# Patient Record
Sex: Female | Born: 1935 | Race: White | Hispanic: No | Smoking: Never smoker
Health system: Southern US, Community
[De-identification: ages and names within clinical notes are randomized; demographics above are authoritative.]

## PROBLEM LIST (undated history)

## (undated) DIAGNOSIS — I872 Venous insufficiency (chronic) (peripheral): Secondary | ICD-10-CM

## (undated) DIAGNOSIS — G459 Transient cerebral ischemic attack, unspecified: Secondary | ICD-10-CM

## (undated) DIAGNOSIS — A809 Acute poliomyelitis, unspecified: Secondary | ICD-10-CM

## (undated) DIAGNOSIS — E538 Deficiency of other specified B group vitamins: Secondary | ICD-10-CM

## (undated) DIAGNOSIS — J449 Chronic obstructive pulmonary disease, unspecified: Secondary | ICD-10-CM

## (undated) DIAGNOSIS — I10 Essential (primary) hypertension: Secondary | ICD-10-CM

## (undated) DIAGNOSIS — K219 Gastro-esophageal reflux disease without esophagitis: Secondary | ICD-10-CM

## (undated) DIAGNOSIS — E119 Type 2 diabetes mellitus without complications: Secondary | ICD-10-CM

## (undated) DIAGNOSIS — G14 Postpolio syndrome: Secondary | ICD-10-CM

## (undated) DIAGNOSIS — Z7901 Long term (current) use of anticoagulants: Secondary | ICD-10-CM

## (undated) DIAGNOSIS — K5790 Diverticulosis of intestine, part unspecified, without perforation or abscess without bleeding: Secondary | ICD-10-CM

## (undated) DIAGNOSIS — M1990 Unspecified osteoarthritis, unspecified site: Secondary | ICD-10-CM

## (undated) DIAGNOSIS — T8859XA Other complications of anesthesia, initial encounter: Secondary | ICD-10-CM

## (undated) DIAGNOSIS — E7800 Pure hypercholesterolemia, unspecified: Secondary | ICD-10-CM

## (undated) DIAGNOSIS — E785 Hyperlipidemia, unspecified: Secondary | ICD-10-CM

## (undated) DIAGNOSIS — I4891 Unspecified atrial fibrillation: Secondary | ICD-10-CM

## (undated) DIAGNOSIS — M810 Age-related osteoporosis without current pathological fracture: Secondary | ICD-10-CM

## (undated) DIAGNOSIS — M5136 Other intervertebral disc degeneration, lumbar region: Secondary | ICD-10-CM

## (undated) DIAGNOSIS — E559 Vitamin D deficiency, unspecified: Secondary | ICD-10-CM

## (undated) DIAGNOSIS — Z87442 Personal history of urinary calculi: Secondary | ICD-10-CM

## (undated) HISTORY — PX: BACK SURGERY: SHX140

## (undated) HISTORY — PX: KYPHOPLASTY: SHX5884

## (undated) HISTORY — PX: HIP SURGERY: SHX245

---

## 2004-01-03 ENCOUNTER — Ambulatory Visit: Payer: Self-pay | Admitting: Internal Medicine

## 2005-02-02 ENCOUNTER — Ambulatory Visit: Payer: Self-pay | Admitting: Internal Medicine

## 2005-08-25 ENCOUNTER — Ambulatory Visit: Payer: Self-pay | Admitting: Gastroenterology

## 2005-10-05 ENCOUNTER — Other Ambulatory Visit: Payer: Self-pay

## 2005-10-05 ENCOUNTER — Inpatient Hospital Stay: Payer: Self-pay | Admitting: Unknown Physician Specialty

## 2005-10-15 ENCOUNTER — Emergency Department: Payer: Self-pay | Admitting: Emergency Medicine

## 2006-02-18 ENCOUNTER — Ambulatory Visit: Payer: Self-pay | Admitting: Internal Medicine

## 2007-02-24 ENCOUNTER — Ambulatory Visit: Payer: Self-pay | Admitting: Internal Medicine

## 2008-02-27 ENCOUNTER — Ambulatory Visit: Payer: Self-pay | Admitting: Internal Medicine

## 2008-03-16 HISTORY — PX: FEMUR FRACTURE SURGERY: SHX633

## 2008-03-30 ENCOUNTER — Ambulatory Visit: Payer: Self-pay | Admitting: Family Medicine

## 2008-05-01 ENCOUNTER — Ambulatory Visit: Payer: Self-pay | Admitting: Internal Medicine

## 2009-01-31 ENCOUNTER — Ambulatory Visit: Payer: Self-pay | Admitting: Gastroenterology

## 2009-03-01 ENCOUNTER — Ambulatory Visit: Payer: Self-pay | Admitting: Internal Medicine

## 2009-03-08 ENCOUNTER — Inpatient Hospital Stay: Payer: Self-pay | Admitting: Orthopedic Surgery

## 2009-06-12 ENCOUNTER — Encounter: Payer: Self-pay | Admitting: Orthopedic Surgery

## 2009-06-14 ENCOUNTER — Encounter: Payer: Self-pay | Admitting: Orthopedic Surgery

## 2009-07-14 ENCOUNTER — Encounter: Payer: Self-pay | Admitting: Orthopedic Surgery

## 2009-08-14 ENCOUNTER — Encounter: Payer: Self-pay | Admitting: Orthopedic Surgery

## 2009-09-13 ENCOUNTER — Encounter: Payer: Self-pay | Admitting: Orthopedic Surgery

## 2010-03-13 ENCOUNTER — Ambulatory Visit: Payer: Self-pay | Admitting: Internal Medicine

## 2011-03-26 ENCOUNTER — Ambulatory Visit: Payer: Self-pay | Admitting: Internal Medicine

## 2012-03-31 ENCOUNTER — Ambulatory Visit: Payer: Self-pay | Admitting: Internal Medicine

## 2012-08-05 ENCOUNTER — Emergency Department: Payer: Self-pay | Admitting: Emergency Medicine

## 2012-11-11 ENCOUNTER — Ambulatory Visit: Payer: Self-pay | Admitting: Gastroenterology

## 2012-11-18 LAB — PATHOLOGY REPORT

## 2013-09-27 ENCOUNTER — Ambulatory Visit: Payer: Self-pay | Admitting: Internal Medicine

## 2013-11-25 ENCOUNTER — Emergency Department: Payer: Self-pay | Admitting: Emergency Medicine

## 2014-01-15 ENCOUNTER — Ambulatory Visit: Payer: Self-pay | Admitting: Ophthalmology

## 2014-05-11 ENCOUNTER — Ambulatory Visit: Payer: Self-pay | Admitting: Unknown Physician Specialty

## 2014-05-15 ENCOUNTER — Ambulatory Visit: Payer: Self-pay | Admitting: Orthopedic Surgery

## 2014-07-09 LAB — SURGICAL PATHOLOGY

## 2014-07-15 NOTE — Op Note (Signed)
PATIENT NAME:  Jennifer [name], [name] MR#:  [id number] DATE OF BIRTH:  1931/12/31  DATE OF PROCEDURE:  05/15/2014  PREOPERATIVE DIAGNOSIS: L1 and L4 compression fractures.   POSTOPERATIVE DIAGNOSIS: L1 and L4 compression fracture.   PROCEDURE: L1 and L4 kyphoplasty.   ANESTHESIA: MAC.   SURGEON: Kennedy BuckerMichael Clifford Coudriet, MD   DESCRIPTION OF PROCEDURE: The patient was brought to the operating room and after adequate sedation was given, the patient was placed prone. C-arm was brought in, and L1 and L4 well visualized in both AP and lateral projections. The back was then infiltrated with 1% Xylocaine. After a timeout procedure was completed, the back was then prepped and draped in the usual sterile manner and repeat timeout procedure and patient identification were completed. A spinal needle was then used to get local anesthetic down to the pedicle on the right side at each level with a mixture of 0.5% Sensorcaine with epinephrine and 1% Xylocaine. A small incision was made and the trocar advanced doing L1 first, getting down to the lateral pedicle, advancing into the vertebral body and obtaining a biopsy, drilling and placement of a balloon across the midline, inflating this to about 3 mL with good fill of the vertebral body with expansion of the superior endplate deformity. This was left in place and the identical procedure was carried out at L4. With visualization at L4, the balloon was let down and cement placed into the defect created by the balloon with good interdigitation, approximately 4 mL of bone cement being infiltrated into the L4 vertebral body. A trocar was removed and permanent C-arm views obtained. Going up to L1, it was felt in a similar fashion with approximately 4.5 mL of bone cement with good fill of the vertebral body. Trocar was removed and AP, lateral permanent views obtained. The wounds were closed with Dermabond followed by a Band-Aid. The patient was sent to the recovery room in stable condition.    ESTIMATED BLOOD LOSS: Minimal.   COMPLICATIONS: None.   SPECIMEN: L1 and L4 vertebral body biopsy.  FINDINGS: The bone was quite soft on placement of the trocar as well as drilling in the biopsy.   ____________________________ Leitha SchullerMichael J. Opaline Reyburn, MD mjm:TM D: 05/15/2014 18:35:47 ET T: 05/16/2014 00:46:44 ET JOB#: [id number]  cc: Leitha SchullerMichael J. Llesenia Fogal, MD, <Dictator> Leitha SchullerMICHAEL J Salsabeel Gorelick MD ELECTRONICALLY SIGNED 05/16/2014 7:31

## 2014-11-08 ENCOUNTER — Emergency Department: Payer: Medicare Other

## 2014-11-08 ENCOUNTER — Emergency Department
Admission: EM | Admit: 2014-11-08 | Discharge: 2014-11-08 | Disposition: A | Discharge status: Home / Self Care | Payer: Medicare Other | Attending: Emergency Medicine | Admitting: Emergency Medicine

## 2014-11-08 ENCOUNTER — Encounter: Payer: Self-pay | Admitting: Emergency Medicine

## 2014-11-08 DIAGNOSIS — Y92018 Other place in single-family (private) house as the place of occurrence of the external cause: Secondary | ICD-10-CM | POA: Insufficient documentation

## 2014-11-08 DIAGNOSIS — Z8612 Personal history of poliomyelitis: Secondary | ICD-10-CM | POA: Diagnosis not present

## 2014-11-08 DIAGNOSIS — T1490XA Injury, unspecified, initial encounter: Secondary | ICD-10-CM

## 2014-11-08 DIAGNOSIS — X58XXXA Exposure to other specified factors, initial encounter: Secondary | ICD-10-CM | POA: Insufficient documentation

## 2014-11-08 DIAGNOSIS — M25561 Pain in right knee: Secondary | ICD-10-CM

## 2014-11-08 DIAGNOSIS — Y998 Other external cause status: Secondary | ICD-10-CM | POA: Diagnosis not present

## 2014-11-08 DIAGNOSIS — S50311A Abrasion of right elbow, initial encounter: Secondary | ICD-10-CM | POA: Insufficient documentation

## 2014-11-08 DIAGNOSIS — S8391XA Sprain of unspecified site of right knee, initial encounter: Secondary | ICD-10-CM | POA: Insufficient documentation

## 2014-11-08 DIAGNOSIS — S8991XA Unspecified injury of right lower leg, initial encounter: Secondary | ICD-10-CM | POA: Diagnosis present

## 2014-11-08 DIAGNOSIS — Y9389 Activity, other specified: Secondary | ICD-10-CM | POA: Insufficient documentation

## 2014-11-08 HISTORY — DX: Unspecified osteoarthritis, unspecified site: M19.90

## 2014-11-08 NOTE — ED Provider Notes (Signed)
[facility] Emergency Department Provider Note  ____________________________________________  Time seen: Approximately 9:40 PM  I have reviewed the triage vital signs and the nursing notes.   HISTORY  Chief Complaint Fall    HPI Jennifer [name] is a 81 y.o. 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  Reportedly she was walking out of the porch to get mail and she hurt her right knee pop.  I will then to the ground and had to crawl a little bit to get to a surface that he continues to pull herself up.  She is having some pain in that knee.  She did not sustain any other injuries other than a small abrasion near her right elbow.  She did not hit her head, lose consciousness, nor sustain any neck injuries.  She is able to walk with a slight limp but was worried given the her right leg as her "good leg".   Past Medical History  Diagnosis Date  . Arthritis     There are no active problems to display for this patient.   Past Surgical History  Procedure Laterality Date  . Back surgery    . Femur fracture surgery  2010    Current Outpatient Rx  Name  Route  Sig  Dispense  Refill  . ibuprofen (ADVIL,MOTRIN) 200 MG tablet   Oral   Take 200 mg by mouth every 6 (six) hours as needed for mild pain.           Allergies Review of patient's allergies indicates no known allergies.  History reviewed. No pertinent family history.  Social History Social History  Substance Use Topics  . Smoking status: Never Smoker   . Smokeless tobacco: None  . Alcohol Use: No    Review of Systems Cardiovascular: Denies chest pain. Respiratory: Denies shortness of breath. Gastrointestinal: No abdominal pain.  No nausea, no vomiting.  No diarrhea.  No constipation. Genitourinary: Negative for dysuria. Musculoskeletal: Negative for back pain.   Pain in her right knee Skin: Negative for rash.  Slight abrasion on right arm near her elbow Neurological: Negative for headaches, focal weakness or numbness.   ____________________________________________   PHYSICAL EXAM:  VITAL SIGNS: ED Triage Vitals  Enc Vitals Group     BP 11/08/14 2059 118/88 mmHg     Pulse Rate 11/08/14 2059 73     Resp 11/08/14 2059 18     Temp 11/08/14 2059 98.1 F (36.7 C)     Temp src --      SpO2 11/08/14 2059 93 %     Weight 11/08/14 2059 140 lb (63.504 kg)     Height 11/08/14 2059  (1.626 m)     Head Cir --      Peak Flow --      Pain Score 11/08/14 2059 5     Pain Loc --      Pain Edu? --      Excl. in GC? --     Constitutional: Alert and oriented. Well appearing and in no acute distress. Eyes: Conjunctivae are normal. PERRL. EOMI. Head: Atraumatic. Neck: No stridor.  No cervical spine tenderness to palpation. Cardiovascular: Normal rate, regular rhythm. Grossly normal heart sounds.  Good peripheral circulation. Respiratory: Normal respiratory effort.  No retractions. Lungs CTAB. Musculoskeletal: No ecchymosis nor lacerations the right knee.  Normal range of motion  with very minimal tenderness.  Knee is stable.  No deformities.  No tenderness to palpation.  Small infrapatellar effusion that the patient states is chronic. Neurologic:  Normal speech and language. No gross focal neurologic deficits are appreciated.  Skin:  Skin is warm, dry and intact. No rash noted.  Very mild abrasion to a dime-sized area near her right elbow. Psychiatric: Mood and affect are normal. Speech and behavior are normal.  ____________________________________________   LABS (all labs ordered are listed, but only abnormal results are displayed)  Not indicated ____________________________________________  EKG  Not indicated ____________________________________________  RADIOLOGY  Dg Knee Complete 4 Views Right  11/08/2014   CLINICAL DATA:  Patient  went to get the mail today and hurt her right knee pop with right knee pain.  EXAM: RIGHT KNEE - COMPLETE 4+ VIEW  COMPARISON:  March 10, 2009  FINDINGS: There is no evidence of fracture, dislocation, or joint effusion. Patient status post fixation of the femur. There is no malalignment. Degenerative joint changes of the right knee with narrowed joint space and osteophyte formation are noted. Soft tissues are unremarkable.  IMPRESSION: No acute abnormality.   Electronically Signed   By: Sherian Rein M.D.   On: 11/08/2014 21:35    ____________________________________________   PROCEDURES  Procedure(s) performed: None  Critical Care performed: No ____________________________________________   INITIAL IMPRESSION / ASSESSMENT AND PLAN / ED COURSE  Pertinent labs & imaging results that were available during my care of the patient were reviewed by me and considered in my medical decision making (see chart for details).  The patient is well-appearing and in no acute distress.  She has no evidence of bony injury to the right knee and she is able to bear weight although with a slight limp.  I advised that she allow [place] to apply an Ace wrap and she can follow up next week with Dr. Gavin Potters or Dr. Martha Clan, both of whom she has seen in the past.  I offered crutches but the patient states that she has a walker at home and would prefer to use that.  I explained that I cannot rule out a ligamentous injury or other internal derangement of the knee, but I explained that her exam is reassuring today.  I gave her my usual and customary return precautions.  I advised the use of over-the-counter pain medication and she agrees.  ____________________________________________  FINAL CLINICAL IMPRESSION(S) / ED DIAGNOSES  Final diagnoses:  Pain of right knee after injury      NEW MEDICATIONS STARTED DURING THIS VISIT:  New Prescriptions   No medications on file     Loleta Rose, MD 11/08/14 2309

## 2014-11-08 NOTE — ED Notes (Signed)
Pt. States she went out to porch to get mail, when she heard her rt. Knee "pop".  Pt. States she went down on rt. Knee.  Pt. States pain to rt. Knee.  Pt. Denies LOC.

## 2014-11-08 NOTE — Discharge Instructions (Signed)
Fortunately does not appear that you causes severe injury to your right knee today.  You may have some internal injuries to the soft tissue of the knee that are not yet apparent.  Please rest the knee when possible, use ice packs and elevation, and follow-up with Dr. Martha Clan or Dr. Gavin Potters next week.  If you develop new or worsening symptoms that concern you, please return immediately to the emergency department.   Knee Sprain A knee sprain is a tear in one of the strong, fibrous tissues that connect the bones (ligaments) in your knee. The severity of the sprain depends on how much of the ligament is torn. The tear can be either partial or complete. CAUSES  Often, sprains are a result of a fall or injury. The force of the impact causes the fibers of your ligament to stretch too much. This excess tension causes the fibers of your ligament to tear. SIGNS AND SYMPTOMS  You may have some loss of motion in your knee. Other symptoms include:  Bruising.  Pain in the knee area.  Tenderness of the knee to the touch.  Swelling. DIAGNOSIS  To diagnose a knee sprain, your health care provider will physically examine your knee. Your health care provider may also suggest an X-ray exam of your knee to make sure no bones are broken. TREATMENT  If your ligament is only partially torn, treatment usually involves keeping the knee in a fixed position (immobilization) or bracing your knee for activities that require movement for several weeks. To do this, your health care provider will apply a bandage, cast, or splint to keep your knee from moving and to support your knee during movement until it heals. For a partially torn ligament, the healing process usually takes 4-6 weeks. If your ligament is completely torn, depending on which ligament it is, you may need surgery to reconnect the ligament to the bone or reconstruct it. After surgery, a cast or splint may be applied and will need to stay on your knee for 4-6  weeks while your ligament heals. HOME CARE INSTRUCTIONS  Keep your injured knee elevated to decrease swelling.  To ease pain and swelling, apply ice to the injured area:  Put ice in a plastic bag.  Place a towel between your skin and the bag.  Leave the ice on for 20 minutes, 2-3 times a day.  Only take medicine for pain as directed by your health care provider.  Do not leave your knee unprotected until pain and stiffness go away (usually 4-6 weeks).  If you have a cast or splint, do not allow it to get wet. If you have been instructed not to remove it, cover it with a plastic bag when you shower or bathe. Do not swim.  Your health care provider may suggest exercises for you to do during your recovery to prevent or limit permanent weakness and stiffness. SEEK IMMEDIATE MEDICAL CARE IF:  Your cast or splint becomes damaged.  Your pain becomes worse.  You have significant pain, swelling, or numbness below the cast or splint. MAKE SURE YOU:  Understand these instructions.  Will watch your condition.  Will get help right away if you are not doing well or get worse. Document Released: 03/02/2005 Document Revised: 12/21/2012 Document Reviewed: 10/12/2012 [facility] Patient Information 2015 [place], [place]. This information is not intended to replace advice given to you by your health care provider. Make sure you discuss any questions you have with your health care provider.

## 2015-03-01 ENCOUNTER — Encounter: Payer: Self-pay | Admitting: Emergency Medicine

## 2015-03-01 ENCOUNTER — Emergency Department: Payer: Medicare Other

## 2015-03-01 ENCOUNTER — Emergency Department
Admission: EM | Admit: 2015-03-01 | Discharge: 2015-03-01 | Disposition: A | Discharge status: Home / Self Care | Payer: Medicare Other | Attending: Emergency Medicine | Admitting: Emergency Medicine

## 2015-03-01 DIAGNOSIS — Y9389 Activity, other specified: Secondary | ICD-10-CM | POA: Diagnosis not present

## 2015-03-01 DIAGNOSIS — T7121XA Asphyxiation due to cave-in or falling earth, initial encounter: Secondary | ICD-10-CM

## 2015-03-01 DIAGNOSIS — S32502D Unspecified fracture of left pubis, subsequent encounter for fracture with routine healing: Secondary | ICD-10-CM | POA: Diagnosis not present

## 2015-03-01 DIAGNOSIS — S79912A Unspecified injury of left hip, initial encounter: Secondary | ICD-10-CM | POA: Diagnosis present

## 2015-03-01 DIAGNOSIS — Y998 Other external cause status: Secondary | ICD-10-CM | POA: Diagnosis not present

## 2015-03-01 DIAGNOSIS — Y9289 Other specified places as the place of occurrence of the external cause: Secondary | ICD-10-CM | POA: Insufficient documentation

## 2015-03-01 DIAGNOSIS — S32592D Other specified fracture of left pubis, subsequent encounter for fracture with routine healing: Secondary | ICD-10-CM

## 2015-03-01 DIAGNOSIS — W010XXD Fall on same level from slipping, tripping and stumbling without subsequent striking against object, subsequent encounter: Secondary | ICD-10-CM | POA: Diagnosis not present

## 2015-03-01 MED ORDER — KETOROLAC TROMETHAMINE 60 MG/2ML IM SOLN
60.0000 mg | Freq: Once | INTRAMUSCULAR | Status: AC
  Administered 2015-03-01: 60 mg via INTRAMUSCULAR
  Filled 2015-03-01: qty 2

## 2015-03-01 MED ORDER — IBUPROFEN 600 MG PO TABS: 600.0000 mg | ORAL_TABLET | Freq: Three times a day (TID) | ORAL | Status: DC | PRN

## 2015-03-01 NOTE — ED Notes (Signed)
Pt states she got tripped up this am, fell back and states she landed on her back side, here with c/o pain in her left hip and groin area. Appears in no distress at this time. History of left hip replacement years ago.

## 2015-03-01 NOTE — ED Provider Notes (Addendum)
[facility] Emergency Department Provider Note  ____________________________________________  Time seen: Approximately 2:21 PM  I have reviewed the triage vital signs and the nursing notes.   HISTORY  Chief Complaint Fall and Hip Pain    HPI Jennifer [name] is a 81 y.o. female with a history of polio in the left leg requiring a brace for walking, remote left hip arthroplasty, remote right hip arthroplasty, presenting with mechanical fall and left pelvis pain. Patient reports that prior to arrival she tried to move backwards in her apartment but her sock was stuck to some glue on the ground and she fell backwards landing on her bottom. No LOC, no pain in the neck or back, no chest pain, shortness of breath, palpitations, numbness tingling or weakness.   Past Medical History  Diagnosis Date  . Arthritis     There are no active problems to display for this patient.   Past Surgical History  Procedure Laterality Date  . Back surgery    . Femur fracture surgery  2010    Current Outpatient Rx  Name  Route  Sig  Dispense  Refill  . ibuprofen (ADVIL,MOTRIN) 200 MG tablet   Oral   Take 200 mg by mouth every 6 (six) hours as needed for mild pain.           Allergies Review of patient's allergies indicates no known allergies.  No family history on file.  Social History Social History  Substance Use Topics  . Smoking status: Never Smoker   . Smokeless tobacco: None  . Alcohol Use: No    Review of Systems Constitutional: No fever/chills. No syncope or lightheadedness. Eyes: No visual changes. No blurred or double vision. ENT: No sore throat. Cardiovascular: Denies chest pain, palpitations. Respiratory: Denies shortness of breath.  No cough. Gastrointestinal: No abdominal pain.  No nausea, no vomiting.  No diarrhea.  No constipation. Genitourinary: Negative for dysuria. Musculoskeletal: Negative for back pain. Left pelvis pain. Skin:  Negative for rash. Neurological: Negative for headaches, focal weakness or numbness.  10-point ROS otherwise negative.  ____________________________________________   PHYSICAL EXAM:  VITAL SIGNS: ED Triage Vitals  Enc Vitals Group     BP 03/01/15 1320 141/99 mmHg     Pulse Rate 03/01/15 1320 91     Resp 03/01/15 1320 18     Temp 03/01/15 1320 98.1 F (36.7 C)     Temp Source 03/01/15 1320 Oral     SpO2 03/01/15 1320 97 %     Weight 03/01/15 1320 139 lb (63.05 kg)     Height 03/01/15 1320  (1.626 m)     Head Cir --      Peak Flow --      Pain Score 03/01/15 1320 0     Pain Loc --      Pain Edu? --      Excl. in GC? --     Constitutional: Alert and oriented. Well appearing and in no acute distress. Answer question appropriately. Eyes: Conjunctivae are normal.  EOMI. no scleral icterus. Head: Atraumatic. Nose: No congestion/rhinnorhea. Mouth/Throat: Mucous membranes are moist.  Neck: No stridor.  Supple.  No midline C-spine tenderness, step-offs or deformities. Cardiovascular: Normal rate,   Respiratory: Normal respiratory effort.   Gastrointestinal: Soft and nontender. No distention. No peritoneal signs. Musculoskeletal: No LE edema. Pelvis is stable. Full range of motion of bilateral ankles knees and hips without pain. Pain is reproducible with palpation over the left side of the  pubic symphysis. Normal DP and PT pulses bilaterally. 5 out of 5 dorsi flexion and plantar flexion. Decreased muscle bulk in the left leg which is likely chronic. Neurologic:  Normal speech and language. No gross focal neurologic deficits are appreciated.  Skin:  Skin is warm, dry and intact. No rash noted. Psychiatric: Mood and affect are normal. Speech and behavior are normal.  Normal judgement.  ____________________________________________   LABS (all labs ordered are listed, but only abnormal results are displayed)  Labs Reviewed - No data to  display ____________________________________________  EKG  Not indicated ____________________________________________  RADIOLOGY  Dg Hip Unilat With Pelvis 2-3 Views Left  03/01/2015  CLINICAL DATA:  Left hip pain following fall today, initial encounter EXAM: DG HIP (WITH OR WITHOUT PELVIS) 2-3V LEFT COMPARISON:  None. FINDINGS: Left hip prosthesis is noted. No definitive loosening or acute fracture is noted in the proximal femur. The well corticated lucency is noted in the inferior pubic ramus on the left likely representing prior fracture. Changes of prior vertebral augmentation in the lumbar spine are seen. IMPRESSION: Postsurgical changes identified. Changes of prior inferior pubic ramus fracture are seen. No acute abnormality is noted. Electronically Signed   By: Alcide CleverMark  Lukens M.D.   On: 03/01/2015 14:13    ____________________________________________   PROCEDURES  Procedure(s) performed: None  Critical Care performed: No ____________________________________________   INITIAL IMPRESSION / ASSESSMENT AND PLAN / ED COURSE  Pertinent labs & imaging results that were available during my care of the patient were reviewed by me and considered in my medical decision making (see chart for details).  81 y.o. female with a history of polio in the left leg requiring braces to walk, bilateral hip arthroplasties, presenting with left pubic symphysis pain after fall. I will get a pelvis and hip x-ray. Initiate symptomatically treatment.  ----------------------------------------- 2:25 PM on 03/01/2015 -----------------------------------------  Patient has an x-ray which shows prior inferior pubic ramus fracture but no new findings.  Given that a stable pubic ramus fracture is weight bear as tolerated and nonoperative, there is no indication for CT scan for further evaluation. I will attempt symptomatic treatment with a attempt at ambulation with a walker. If she is steady she'll be able to  be discharged home.  ____________________________________________  FINAL CLINICAL IMPRESSION(S) / ED DIAGNOSES  Final diagnoses:  Accidental mechanical suffocation by falling material, initial encounter  Pubic ramus fracture, left, with routine healing, subsequent encounter      NEW MEDICATIONS STARTED DURING THIS VISIT:  New Prescriptions   No medications on file     Rockne MenghiniAnne-Caroline Makhai Fulco, MD 03/01/15 1428  Rockne MenghiniAnne-Caroline Jeanae Whitmill, MD 03/01/15 1429

## 2015-03-01 NOTE — Discharge Instructions (Signed)
Please take Motrin or Tylenol for mild to moderate pain. You may take Percocet for severe pain but be careful because this medication can make you even more unsteady on your feet. No driving for 8 hours after taking Percocet.  Please use your walker at all times to prevent any further falls until your pain has completely resolved.  Please return to the emergency department if you develop severe pain, inability to walk, numbness tingling or weakness, or any other symptoms concerning to you.

## 2015-04-02 ENCOUNTER — Emergency Department: Payer: Medicare Other

## 2015-04-02 ENCOUNTER — Emergency Department
Admission: EM | Admit: 2015-04-02 | Discharge: 2015-04-02 | Disposition: A | Discharge status: Other Acute Inpatient Hospital | Payer: Medicare Other | Attending: Emergency Medicine | Admitting: Emergency Medicine

## 2015-04-02 ENCOUNTER — Encounter: Payer: Self-pay | Admitting: Emergency Medicine

## 2015-04-02 DIAGNOSIS — S2242XA Multiple fractures of ribs, left side, initial encounter for closed fracture: Secondary | ICD-10-CM | POA: Insufficient documentation

## 2015-04-02 DIAGNOSIS — S2241XA Multiple fractures of ribs, right side, initial encounter for closed fracture: Secondary | ICD-10-CM | POA: Diagnosis not present

## 2015-04-02 DIAGNOSIS — S270XXA Traumatic pneumothorax, initial encounter: Secondary | ICD-10-CM | POA: Diagnosis not present

## 2015-04-02 DIAGNOSIS — Y92410 Unspecified street and highway as the place of occurrence of the external cause: Secondary | ICD-10-CM | POA: Insufficient documentation

## 2015-04-02 DIAGNOSIS — Y9389 Activity, other specified: Secondary | ICD-10-CM | POA: Diagnosis not present

## 2015-04-02 DIAGNOSIS — Z79899 Other long term (current) drug therapy: Secondary | ICD-10-CM | POA: Insufficient documentation

## 2015-04-02 DIAGNOSIS — I10 Essential (primary) hypertension: Secondary | ICD-10-CM | POA: Diagnosis not present

## 2015-04-02 DIAGNOSIS — S161XXA Strain of muscle, fascia and tendon at neck level, initial encounter: Secondary | ICD-10-CM

## 2015-04-02 DIAGNOSIS — J939 Pneumothorax, unspecified: Secondary | ICD-10-CM

## 2015-04-02 DIAGNOSIS — M542 Cervicalgia: Secondary | ICD-10-CM | POA: Diagnosis not present

## 2015-04-02 DIAGNOSIS — S29001A Unspecified injury of muscle and tendon of front wall of thorax, initial encounter: Secondary | ICD-10-CM | POA: Diagnosis present

## 2015-04-02 DIAGNOSIS — Y998 Other external cause status: Secondary | ICD-10-CM | POA: Diagnosis not present

## 2015-04-02 DIAGNOSIS — S2222XA Fracture of body of sternum, initial encounter for closed fracture: Secondary | ICD-10-CM | POA: Insufficient documentation

## 2015-04-02 DIAGNOSIS — S2220XA Unspecified fracture of sternum, initial encounter for closed fracture: Secondary | ICD-10-CM

## 2015-04-02 DIAGNOSIS — R079 Chest pain, unspecified: Secondary | ICD-10-CM

## 2015-04-02 HISTORY — DX: Essential (primary) hypertension: I10

## 2015-04-02 LAB — TYPE AND SCREEN
ABO/RH(D): O POS
ANTIBODY SCREEN: NEGATIVE

## 2015-04-02 LAB — CBC
HCT: 39.8 % (ref 35.0–47.0)
HEMOGLOBIN: 13.1 g/dL (ref 12.0–16.0)
MCH: 31.3 pg (ref 26.0–34.0)
MCHC: 32.9 g/dL (ref 32.0–36.0)
MCV: 95.1 fL (ref 80.0–100.0)
Platelets: 199 10*3/uL (ref 150–440)
RBC: 4.18 MIL/uL (ref 3.80–5.20)
RDW: 14.5 % (ref 11.5–14.5)
WBC: 8.7 10*3/uL (ref 3.6–11.0)

## 2015-04-02 LAB — BASIC METABOLIC PANEL
Anion gap: 9 (ref 5–15)
BUN: 23 mg/dL — AB (ref 6–20)
CHLORIDE: 107 mmol/L (ref 101–111)
CO2: 24 mmol/L (ref 22–32)
Calcium: 9 mg/dL (ref 8.9–10.3)
Creatinine, Ser: 0.59 mg/dL (ref 0.44–1.00)
GFR calc Af Amer: >60 mL/min (ref >60)
GFR calc non Af Amer: >60 mL/min (ref >60)
GLUCOSE: 162 mg/dL — AB (ref 65–99)
POTASSIUM: 3.1 mmol/L — AB (ref 3.5–5.1)
Sodium: 140 mmol/L (ref 135–145)

## 2015-04-02 LAB — PROTIME-INR
INR: 0.96
Prothrombin Time: 13 seconds (ref 11.4–15.0)

## 2015-04-02 LAB — APTT: aPTT: 24 seconds (ref 24–36)

## 2015-04-02 LAB — ABO/RH: ABO/RH(D): O POS

## 2015-04-02 MED ORDER — IOHEXOL 350 MG/ML SOLN
75.0000 mL | Freq: Once | INTRAVENOUS | Status: AC | PRN
  Administered 2015-04-02: 75 mL via INTRAVENOUS

## 2015-04-02 MED ORDER — FENTANYL CITRATE (PF) 100 MCG/2ML IJ SOLN
INTRAMUSCULAR | Status: AC
  Administered 2015-04-02: 50 ug via INTRAVENOUS
  Filled 2015-04-02: qty 2

## 2015-04-02 MED ORDER — FENTANYL CITRATE (PF) 100 MCG/2ML IJ SOLN
25.0000 ug | INTRAMUSCULAR | Status: AC | PRN
  Administered 2015-04-02 (×2): 25 ug via INTRAVENOUS
  Filled 2015-04-02 (×2): qty 2

## 2015-04-02 MED ORDER — LIDOCAINE-EPINEPHRINE (PF) 1 %-1:200000 IJ SOLN
INTRAMUSCULAR | Status: AC
  Administered 2015-04-02: 30 mL
  Filled 2015-04-02: qty 30

## 2015-04-02 MED ORDER — ALBUTEROL SULFATE (2.5 MG/3ML) 0.083% IN NEBU
INHALATION_SOLUTION | RESPIRATORY_TRACT | Status: AC
  Filled 2015-04-02: qty 9

## 2015-04-02 MED ORDER — LORAZEPAM 2 MG/ML IJ SOLN
0.5000 mg | Freq: Once | INTRAMUSCULAR | Status: AC
  Administered 2015-04-02: 0.5 mg via INTRAVENOUS

## 2015-04-02 MED ORDER — SODIUM CHLORIDE 0.9 % IV BOLUS (SEPSIS): 1000.0000 mL | Freq: Once | INTRAVENOUS | Status: DC

## 2015-04-02 MED ORDER — LORAZEPAM 2 MG/ML IJ SOLN
INTRAMUSCULAR | Status: AC
  Administered 2015-04-02: 0.5 mg via INTRAVENOUS
  Filled 2015-04-02: qty 1

## 2015-04-02 MED ORDER — ACETAMINOPHEN 500 MG PO TABS: 1000.0000 mg | ORAL_TABLET | Freq: Once | ORAL | Status: DC

## 2015-04-02 MED ORDER — FENTANYL CITRATE (PF) 100 MCG/2ML IJ SOLN
50.0000 ug | Freq: Once | INTRAMUSCULAR | Status: AC
  Administered 2015-04-02: 50 ug via INTRAVENOUS
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
## 2015-04-02 NOTE — ED Notes (Signed)
Called charge nurse [facility] about incoming patient
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
## 2015-04-02 NOTE — ED Notes (Signed)
Family at bedside. 

## 2015-04-08 ENCOUNTER — Encounter
Admission: RE | Admit: 2015-04-08 | Discharge: 2015-04-08 | Disposition: A | Discharge status: Home / Self Care | Payer: Medicare Other | Source: Ambulatory Visit | Attending: Internal Medicine | Admitting: Internal Medicine

## 2015-04-17 ENCOUNTER — Encounter
Admission: RE | Admit: 2015-04-17 | Discharge: 2015-04-17 | Disposition: A | Discharge status: Home / Self Care | Payer: Medicare Other | Source: Ambulatory Visit | Attending: Internal Medicine | Admitting: Internal Medicine

## 2016-03-01 ENCOUNTER — Encounter: Payer: Self-pay | Admitting: Emergency Medicine

## 2016-03-01 ENCOUNTER — Emergency Department: Payer: Medicare Other

## 2016-03-01 ENCOUNTER — Observation Stay
Admission: EM | Admit: 2016-03-01 | Discharge: 2016-03-02 | Disposition: A | Discharge status: Home / Self Care | Payer: Medicare Other | Attending: Internal Medicine | Admitting: Internal Medicine

## 2016-03-01 DIAGNOSIS — Z23 Encounter for immunization: Secondary | ICD-10-CM | POA: Insufficient documentation

## 2016-03-01 DIAGNOSIS — I480 Paroxysmal atrial fibrillation: Principal | ICD-10-CM | POA: Insufficient documentation

## 2016-03-01 DIAGNOSIS — R42 Dizziness and giddiness: Secondary | ICD-10-CM | POA: Diagnosis not present

## 2016-03-01 DIAGNOSIS — Z79899 Other long term (current) drug therapy: Secondary | ICD-10-CM | POA: Diagnosis not present

## 2016-03-01 DIAGNOSIS — M6281 Muscle weakness (generalized): Secondary | ICD-10-CM

## 2016-03-01 DIAGNOSIS — R2681 Unsteadiness on feet: Secondary | ICD-10-CM

## 2016-03-01 DIAGNOSIS — I10 Essential (primary) hypertension: Secondary | ICD-10-CM | POA: Insufficient documentation

## 2016-03-01 DIAGNOSIS — E7800 Pure hypercholesterolemia, unspecified: Secondary | ICD-10-CM | POA: Diagnosis not present

## 2016-03-01 DIAGNOSIS — I4891 Unspecified atrial fibrillation: Secondary | ICD-10-CM | POA: Diagnosis present

## 2016-03-01 HISTORY — DX: Acute poliomyelitis, unspecified: A80.9

## 2016-03-01 HISTORY — DX: Pure hypercholesterolemia, unspecified: E78.00

## 2016-03-01 LAB — BASIC METABOLIC PANEL
Anion gap: 5 (ref 5–15)
BUN: 18 mg/dL (ref 6–20)
CALCIUM: 9.2 mg/dL (ref 8.9–10.3)
CHLORIDE: 107 mmol/L (ref 101–111)
CO2: 27 mmol/L (ref 22–32)
CREATININE: 0.8 mg/dL (ref 0.44–1.00)
GFR calc non Af Amer: >60 mL/min (ref >60)
Glucose, Bld: 207 mg/dL — ABNORMAL HIGH (ref 65–99)
Potassium: 3.8 mmol/L (ref 3.5–5.1)
SODIUM: 139 mmol/L (ref 135–145)

## 2016-03-01 LAB — MAGNESIUM
MAGNESIUM: 1.9 mg/dL (ref 1.7–2.4)
MAGNESIUM: 1.9 mg/dL (ref 1.7–2.4)

## 2016-03-01 LAB — CBC
HCT: 40.8 % (ref 35.0–47.0)
Hemoglobin: 13.8 g/dL (ref 12.0–16.0)
MCH: 31.6 pg (ref 26.0–34.0)
MCHC: 33.8 g/dL (ref 32.0–36.0)
MCV: 93.5 fL (ref 80.0–100.0)
PLATELETS: 231 10*3/uL (ref 150–440)
RBC: 4.37 MIL/uL (ref 3.80–5.20)
RDW: 13.7 % (ref 11.5–14.5)
WBC: 5.8 10*3/uL (ref 3.6–11.0)

## 2016-03-01 LAB — TROPONIN I: Troponin I: <0.03 ng/mL (ref <0.03)

## 2016-03-01 MED ORDER — RIVAROXABAN 20 MG PO TABS
20.0000 mg | ORAL_TABLET | Freq: Every day | ORAL | Status: DC
  Administered 2016-03-01 – 2016-03-02 (×2): 20 mg via ORAL
  Filled 2016-03-01 (×2): qty 1

## 2016-03-01 MED ORDER — ONDANSETRON HCL 4 MG PO TABS: 4.0000 mg | ORAL_TABLET | Freq: Four times a day (QID) | ORAL | Status: DC | PRN

## 2016-03-01 MED ORDER — LISINOPRIL 20 MG PO TABS
40.0000 mg | ORAL_TABLET | Freq: Every day | ORAL | Status: DC
  Administered 2016-03-02: 40 mg via ORAL
  Filled 2016-03-01: qty 2

## 2016-03-01 MED ORDER — SIMVASTATIN 40 MG PO TABS: 40.0000 mg | ORAL_TABLET | Freq: Every day | ORAL | Status: DC

## 2016-03-01 MED ORDER — SODIUM CHLORIDE 0.9% FLUSH
3.0000 mL | Freq: Two times a day (BID) | INTRAVENOUS | Status: DC
  Administered 2016-03-02: 3 mL via INTRAVENOUS

## 2016-03-01 MED ORDER — ACETAMINOPHEN 325 MG PO TABS: 650.0000 mg | ORAL_TABLET | Freq: Four times a day (QID) | ORAL | Status: DC | PRN

## 2016-03-01 MED ORDER — PANTOPRAZOLE SODIUM 40 MG PO TBEC
40.0000 mg | DELAYED_RELEASE_TABLET | Freq: Every day | ORAL | Status: DC
  Administered 2016-03-02: 40 mg via ORAL
  Filled 2016-03-01: qty 1

## 2016-03-01 MED ORDER — ACETAMINOPHEN 650 MG RE SUPP: 650.0000 mg | Freq: Four times a day (QID) | RECTAL | Status: DC | PRN

## 2016-03-01 MED ORDER — SODIUM CHLORIDE 0.9 % IV SOLN: 250.0000 mL | INTRAVENOUS | Status: DC | PRN

## 2016-03-01 MED ORDER — SODIUM CHLORIDE 0.9% FLUSH
3.0000 mL | Freq: Two times a day (BID) | INTRAVENOUS | Status: DC
  Administered 2016-03-01: 3 mL via INTRAVENOUS

## 2016-03-01 MED ORDER — ASPIRIN 81 MG PO CHEW
324.0000 mg | CHEWABLE_TABLET | Freq: Once | ORAL | Status: AC
  Administered 2016-03-01: 324 mg via ORAL
  Filled 2016-03-01: qty 4

## 2016-03-01 MED ORDER — METOPROLOL TARTRATE 25 MG PO TABS
25.0000 mg | ORAL_TABLET | Freq: Two times a day (BID) | ORAL | Status: DC
  Administered 2016-03-01 – 2016-03-02 (×2): 25 mg via ORAL
  Filled 2016-03-01 (×2): qty 1

## 2016-03-01 MED ORDER — PNEUMOCOCCAL VAC POLYVALENT 25 MCG/0.5ML IJ INJ
0.5000 mL | INJECTION | INTRAMUSCULAR | Status: AC
Start: 2016-03-02 — End: 2016-03-02
  Administered 2016-03-02: 0.5 mL via INTRAMUSCULAR
  Filled 2016-03-01: qty 0.5

## 2016-03-01 MED ORDER — ONDANSETRON HCL 4 MG/2ML IJ SOLN: 4.0000 mg | Freq: Four times a day (QID) | INTRAMUSCULAR | Status: DC | PRN

## 2016-03-01 MED ORDER — ALBUTEROL SULFATE (2.5 MG/3ML) 0.083% IN NEBU: 2.5000 mg | INHALATION_SOLUTION | RESPIRATORY_TRACT | Status: DC | PRN

## 2016-03-01 MED ORDER — SODIUM CHLORIDE 0.9% FLUSH: 3.0000 mL | INTRAVENOUS | Status: DC | PRN

## 2016-03-01 NOTE — Progress Notes (Signed)
Admitted through the ED for Observation of new onset Atrial Fib.  Currently in sinus rhythm.
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
## 2016-03-01 NOTE — H&P (Signed)
[facility] - [place] at [facility]   PATIENT NAME: Jennifer [name]    MR#:  [id number]  DATE OF BIRTH:  Oct 25, 1931  DATE OF ADMISSION:  03/01/2016  PRIMARY CARE PHYSICIAN: Mickey FarberHIES, DAVID, MD   REQUESTING/REFERRING PHYSICIAN: Willy EddyPatrick Robinson, MD  CHIEF COMPLAINT:   Chief Complaint  Patient presents with  . Atrial Fibrillation  . Weakness   Palpitation and malaise today HISTORY OF PRESENT ILLNESS:  Jennifer [name]  is a 82 y.o. female with a known history of Hypertension, arthritis and hyperlipidemia. The patient presently ED with the above chief complaint. The patient said she started to have a palpitation 2 days ago, which improved. However, she started to have palpitation and weakness again today. She denies any chest pain, orthopnea or nocturnal dyspnea or leg edema. She was found A. fib with RVR at 150s, and then self converted to normal sinus rhythm in the ED. ED physician discussed with on-call cardiologist, who suggested start Lopressor and xarelto, place patient for observation.  PAST MEDICAL HISTORY:   Past Medical History:  Diagnosis Date  . Arthritis   . High cholesterol   . Hypertension   . Polio     PAST SURGICAL HISTORY:   Past Surgical History:  Procedure Laterality Date  . BACK SURGERY    . FEMUR FRACTURE SURGERY  2010  . HIP SURGERY Left     SOCIAL HISTORY:   Social History  Substance Use Topics  . Smoking status: Never Smoker  . Smokeless tobacco: Never Used  . Alcohol use No    FAMILY HISTORY:   Family History  Problem Relation Age of Onset  . Stroke Mother   . Heart attack Father   . Diabetes Sister   . Heart attack Sister   . Cancer Brother     DRUG ALLERGIES:  No Known Allergies  REVIEW OF SYSTEMS:   Review of Systems  Constitutional: Positive for malaise/fatigue. Negative for chills and fever.  HENT: Negative for congestion.   Eyes: Negative for blurred vision and double vision.  Respiratory: Negative for cough,  shortness of breath and stridor.   Cardiovascular: Positive for palpitations. Negative for chest pain and leg swelling.  Gastrointestinal: Negative for abdominal pain, blood in stool, diarrhea, melena, nausea and vomiting.  Genitourinary: Negative for dysuria and hematuria.  Musculoskeletal: Negative for joint pain.  Skin: Negative for itching and rash.  Neurological: Negative for dizziness, focal weakness and weakness.  Psychiatric/Behavioral: Negative for depression. The patient is not nervous/anxious.     MEDICATIONS AT HOME:   Prior to Admission medications   Medication Sig Start Date End Date Taking? Authorizing Provider  felodipine (PLENDIL) 2.5 MG 24 hr tablet Take 1 tablet by mouth daily before breakfast. 03/06/15  Yes Historical Provider, MD  ibuprofen (ADVIL,MOTRIN) 600 MG tablet Take 1 tablet (600 mg total) by mouth every 8 (eight) hours as needed for moderate pain (with food). 03/01/15  Yes Anne-Caroline Sharma CovertNorman, MD  lisinopril (PRINIVIL,ZESTRIL) 40 MG tablet Take 1 tablet by mouth daily. 02/25/15  Yes Historical Provider, MD  omeprazole (PRILOSEC) 20 MG capsule Take 1 capsule by mouth 2 (two) times daily. 03/08/15  Yes Historical Provider, MD  simvastatin (ZOCOR) 40 MG tablet Take 1 tablet by mouth daily. 02/25/15  Yes Historical Provider, MD  Vitamin D, Ergocalciferol, (DRISDOL) 50000 units CAPS capsule Take 1 capsule by mouth every 30 (thirty) days. 03/08/15  Yes Historical Provider, MD      VITAL SIGNS:  Blood pressure (!) 146/77, pulse  71, temperature 98 F (36.7 C), temperature source Oral, resp. rate 20, height 5\' 4"  (1.626 m), weight 141 lb (64 kg), SpO2 98 %.  PHYSICAL EXAMINATION:  Physical Exam  GENERAL:  82 y.o.-year-old patient lying in the bed with no acute distress.  EYES: Pupils equal, round, reactive to light and accommodation. No scleral icterus. Extraocular muscles intact.  HEENT: Head atraumatic, normocephalic. Oropharynx and nasopharynx clear.  NECK:   Supple, no jugular venous distention. No thyroid enlargement, no tenderness.  LUNGS: Normal breath sounds bilaterally, no wheezing, rales,rhonchi or crepitation. No use of accessory muscles of respiration.  CARDIOVASCULAR: S1, S2 normal. No murmurs, rubs, or gallops.  ABDOMEN: Soft, nontender, nondistended. Bowel sounds present. No organomegaly or mass.  EXTREMITIES: No pedal edema, cyanosis, or clubbing.  NEUROLOGIC: Cranial nerves II through XII are intact. Muscle strength 5/5 in all extremities. Sensation intact. Gait not checked.  PSYCHIATRIC: The patient is alert and oriented x 3.  SKIN: No obvious rash, lesion, or ulcer.   LABORATORY PANEL:   CBC  Recent Labs Lab 03/01/16 1531  WBC 5.8  HGB 13.8  HCT 40.8  PLT 231   ------------------------------------------------------------------------------------------------------------------  Chemistries   Recent Labs Lab 03/01/16 1531  NA 139  K 3.8  CL 107  CO2 27  GLUCOSE 207*  BUN 18  CREATININE 0.80  CALCIUM 9.2  MG 1.9   ------------------------------------------------------------------------------------------------------------------  Cardiac Enzymes  Recent Labs Lab 03/01/16 1531  TROPONINI <0.03   ------------------------------------------------------------------------------------------------------------------  RADIOLOGY:  Dg Chest 2 View  Result Date: 03/01/2016 CLINICAL DATA:  Weakness. Fluttering sensation in the chest this morning. EXAM: CHEST  2 VIEW COMPARISON:  04/02/2015.  Chest CTA dated 04/02/2015. FINDINGS: Normal sized heart. Tortuous aorta. Small amount of residual linear scarring in the left lower lobe. Otherwise, clear lungs. Thoracic spine degenerative changes. Diffuse osteopenia. Stable thoracolumbar vertebral compression deformities. One again contains kyphoplasty material. IMPRESSION: No acute abnormality. Electronically Signed   By: Beckie SaltsSteven  Reid M.D.   On: 03/01/2016 16:00      IMPRESSION  AND PLAN:   New-onset A. fib with RVR. The patient will be placed for observation. Continued telemetry monitor, start Lopressor and xarelto per on-call cardiologist.  Hypertension. Continue lisinopril and Lopressor.  Questionable diabetes. Check hemoglobin A1c.   All the records are reviewed and case discussed with ED provider. Management plans discussed with the patient, her son and they are in agreement.  CODE STATUS: Full code  TOTAL TIME TAKING CARE OF THIS PATIENT: 51 minutes.    Shaune Pollackhen, Octavio Matheney M.D on 03/01/2016 at 5:52 PM  Between 7am to 6pm - Pager - [phone]  After 6pm go to [email or web address] - Social research officer, governmentpassword [email or web address]  [facility] [place] [facility]  Office  [phone]  CC: Primary care physician; Mickey FarberHIES, DAVID, MD   Note: This dictation was prepared with Dragon dictation along with smaller phrase technology. Any transcriptional errors that result from this process are unintentional.

## 2016-03-01 NOTE — ED Notes (Signed)
Patient transported to X-ray 

## 2016-03-01 NOTE — Care Management Obs Status (Signed)
MEDICARE OBSERVATION STATUS NOTIFICATION   Patient Details  Name: Jennifer [name] MRN: [id number] Date of Birth: 1931/07/10   Medicare Observation Status Notification Given:  Yes    Caren MacadamMichelle Teniyah Seivert, RN 03/01/2016, 6:08 PM

## 2016-03-01 NOTE — Progress Notes (Signed)
ANTICOAGULATION CONSULT NOTE - Initial Consult  Pharmacy Consult for rivaroxaban Indication: atrial fibrillation  No Known Allergies  Patient Measurements: Height: 5\' 4"  (162.6 cm) Weight: 141 lb (64 kg) IBW/kg (Calculated) : 54.7  Vital Signs: Temp: 98 F (36.7 C) (12/17 1524) Temp Source: Oral (12/17 1524) BP: 146/77 (12/17 1630) Pulse Rate: 71 (12/17 1615)  Labs:  Recent Labs  03/01/16 1531  HGB 13.8  HCT 40.8  PLT 231  CREATININE 0.80  TROPONINI <0.03    Estimated Creatinine Clearance: 46.8 mL/min (by C-G formula based on SCr of 0.8 mg/dL).  Actual body weight CrCl= 54 ml/min  Medical History: Past Medical History:  Diagnosis Date  . Arthritis   . High cholesterol   . Hypertension     Assessment: 82 y/o F with a h/o HLD and HTN admitted with palpitations. Pharmacy consulted to dose rivaroxaban for new-onset AF.   Plan:  Rivaroxaban 20 mg daily with supper.   Luisa Harthristy, Bridgett Hattabaugh D 03/01/2016,5:49 PM

## 2016-03-01 NOTE — ED Provider Notes (Signed)
[facility] Emergency Department Provider Note    First MD Initiated Contact with Patient 03/01/16 1524     (approximate)  I have reviewed the triage vital signs and the nursing notes.   HISTORY  Chief Complaint Atrial Fibrillation and Weakness    HPI Jennifer [name] is a 82 y.o. female with a history of high cholesterol and hypertension presents with palpitations that started on Friday and have been intermittent over the weekend. States that it happened again today right around 2:00 where she felt faint and lightheaded with chest fluttering. No shortness of breath or chest pain. She was seen by EMS and their initial 12-lead showed evidence of rapid A. fib in the 150s that self converted on the way to the ER. She is currently chest pain-free she has no complaints. No recent illnesses. No nausea or vomiting. She has no known history of A. fib or dysrhythmia.  Have discussed with the patient and available family all diagnostics and treatments performed thus far and all questions were answered to the best of my ability. The patient demonstrates understanding and agreement with plan.    Past Medical History:  Diagnosis Date  . Arthritis   . High cholesterol   . Hypertension    History reviewed. No pertinent family history. Past Surgical History:  Procedure Laterality Date  . BACK SURGERY    . FEMUR FRACTURE SURGERY  2010  . HIP SURGERY Left    There are no active problems to display for this patient.     Prior to Admission medications   Medication Sig Start Date End Date Taking? Authorizing Provider  felodipine (PLENDIL) 2.5 MG 24 hr tablet Take 1 tablet by mouth daily before breakfast. 03/06/15   Historical Provider, MD  ibuprofen (ADVIL,MOTRIN) 600 MG tablet Take 1 tablet (600 mg total) by mouth every 8 (eight) hours as needed for moderate pain (with food). 03/01/15   Anne-Caroline Sharma Covert, MD  lisinopril (PRINIVIL,ZESTRIL) 40 MG tablet Take 1  tablet by mouth daily. 02/25/15   Historical Provider, MD  omeprazole (PRILOSEC) 20 MG capsule Take 1 capsule by mouth 2 (two) times daily. 03/08/15   Historical Provider, MD  simvastatin (ZOCOR) 40 MG tablet Take 1 tablet by mouth daily. 02/25/15   Historical Provider, MD  Vitamin D, Ergocalciferol, (DRISDOL) 50000 units CAPS capsule Take 1 capsule by mouth every 30 (thirty) days. 03/08/15   Historical Provider, MD    Allergies Patient has no known allergies.    Social History Social History  Substance Use Topics  . Smoking status: Never Smoker  . Smokeless tobacco: Never Used  . Alcohol use No    Review of Systems Patient denies headaches, rhinorrhea, blurry vision, numbness, shortness of breath, chest pain, edema, cough, abdominal pain, nausea, vomiting, diarrhea, dysuria, fevers, rashes or hallucinations unless otherwise stated above in HPI. ____________________________________________   PHYSICAL EXAM:  VITAL SIGNS: Vitals:   03/01/16 1520 03/01/16 1524  BP: (!) 147/81 (!) 147/81  Pulse: 89 83  Resp: 17 20  Temp:  98 F (36.7 C)    Constitutional: Alert and oriented. Well appearing and in no acute distress. Eyes: Conjunctivae are normal. PERRL. EOMI. Head: Atraumatic. Nose: No congestion/rhinnorhea. Mouth/Throat: Mucous membranes are moist.  Oropharynx non-erythematous. Neck: No stridor. Painless ROM. No cervical spine tenderness to palpation Hematological/Lymphatic/Immunilogical: No cervical lymphadenopathy. Cardiovascular: Normal rate, regular rhythm. Grossly normal heart sounds.  Good peripheral circulation. Respiratory: Normal respiratory effort.  No retractions. Lungs CTAB. Gastrointestinal: Soft and nontender. No  distention. No abdominal bruits. No CVA tenderness. Genitourinary:  Musculoskeletal: No lower extremity tenderness nor edema.  No joint effusions. Neurologic:  Normal speech and language. No gross focal neurologic deficits are appreciated. No gait  instability. Skin:  Skin is warm, dry and intact. No rash noted. Psychiatric: Mood and affect are normal. Speech and behavior are normal.  ____________________________________________   LABS (all labs ordered are listed, but only abnormal results are displayed)  Results for orders placed or performed during the hospital encounter of 03/01/16 (from the past 24 hour(s))  Basic metabolic panel     Status: Abnormal   Collection Time: 03/01/16  3:31 PM  Result Value Ref Range   Sodium 139 135 - 145 mmol/L   Potassium 3.8 3.5 - 5.1 mmol/L   Chloride 107 101 - 111 mmol/L   CO2 27 22 - 32 mmol/L   Glucose, Bld 207 (H) 65 - 99 mg/dL   BUN 18 6 - 20 mg/dL   Creatinine, Ser 1.610.80 0.44 - 1.00 mg/dL   Calcium 9.2 8.9 - 09.610.3 mg/dL   GFR calc non Af Amer >60 >60 mL/min   GFR calc Af Amer >60 >60 mL/min   Anion gap 5 5 - 15  CBC     Status: None   Collection Time: 03/01/16  3:31 PM  Result Value Ref Range   WBC 5.8 3.6 - 11.0 K/uL   RBC 4.37 3.80 - 5.20 MIL/uL   Hemoglobin 13.8 12.0 - 16.0 g/dL   HCT 04.540.8 40.935.0 - 81.147.0 %   MCV 93.5 80.0 - 100.0 fL   MCH 31.6 26.0 - 34.0 pg   MCHC 33.8 32.0 - 36.0 g/dL   RDW 91.413.7 78.211.5 - 95.614.5 %   Platelets 231 150 - 440 K/uL  Magnesium     Status: None   Collection Time: 03/01/16  3:31 PM  Result Value Ref Range   Magnesium 1.9 1.7 - 2.4 mg/dL  Troponin I     Status: None   Collection Time: 03/01/16  3:31 PM  Result Value Ref Range   Troponin I <0.03 <0.03 ng/mL  Magnesium     Status: None   Collection Time: 03/01/16  7:37 PM  Result Value Ref Range   Magnesium 1.9 1.7 - 2.4 mg/dL   ____________________________________________  EKG My review and personal interpretation at Time: 15:22   Indication: 80  Rate: 80  Rhythm: sinus Axis: normal Other: normal intervals, no acute abnormality ____________________________________________  RADIOLOGY  I personally reviewed all radiographic images ordered to evaluate for the above acute complaints and  reviewed radiology reports and findings.  These findings were personally discussed with the patient.  Please see medical record for radiology report. ____________________________________________   PROCEDURES  Procedure(s) performed: none Procedures    Critical Care performed: no ____________________________________________   INITIAL IMPRESSION / ASSESSMENT AND PLAN / ED COURSE  Pertinent labs & imaging results that were available during my care of the patient were reviewed by me and considered in my medical decision making (see chart for details).  DDX: afib, paf, aflutter, electrolyte abn, acs  Leandra Kingsmore Colette RibasByrd is a 82 y.o. who presents to the ED with self converted rapid A. fib. Patient is currently well and in no acute distress. She is not on any anticoagulation and does not have any known history of A. fib. We'll check blood work to evaluate for any underlying electrolyte abnormality. EKG shows no acute ischemia. We'll continue to monitor. Patients has a CHADS Vasc score of  5.  Clinical Course as of Mar 01 1656  Wynelle LinkSun Mar 01, 2016  1649 Blood work is reassuring.  [PR]  [phone] Spoke with Dr. Shirlee LatchMcLean who recommends patient be brought into to hospital for observation on telemetry with BB and AC.  Have discussed with the patient and available family all diagnostics and treatments performed thus far and all questions were answered to the best of my ability. The patient demonstrates understanding and agreement with plan.   [PR]    Clinical Course User Index [PR] Willy EddyPatrick Naleigha Raimondi, MD     ____________________________________________   FINAL CLINICAL IMPRESSION(S) / ED DIAGNOSES  Final diagnoses:  Paroxysmal atrial fibrillation with rapid ventricular response (HCC)      NEW MEDICATIONS STARTED DURING THIS VISIT:  New Prescriptions   No medications on file     Note:  This document was prepared using Dragon voice recognition software and may include unintentional  dictation errors.    Willy EddyPatrick Bradi [place]hnot, MD 03/02/16 0030

## 2016-03-02 ENCOUNTER — Observation Stay
Admit: 2016-03-02 | Discharge: 2016-03-02 | Disposition: A | Discharge status: Home / Self Care | Payer: Medicare Other | Attending: Internal Medicine | Admitting: Internal Medicine

## 2016-03-02 DIAGNOSIS — I480 Paroxysmal atrial fibrillation: Secondary | ICD-10-CM | POA: Diagnosis not present

## 2016-03-02 DIAGNOSIS — I5189 Other ill-defined heart diseases: Secondary | ICD-10-CM

## 2016-03-02 HISTORY — DX: Other ill-defined heart diseases: I51.89

## 2016-03-02 LAB — BASIC METABOLIC PANEL
ANION GAP: 5 (ref 5–15)
BUN: 17 mg/dL (ref 6–20)
CALCIUM: 9.2 mg/dL (ref 8.9–10.3)
CO2: 26 mmol/L (ref 22–32)
CREATININE: 0.66 mg/dL (ref 0.44–1.00)
Chloride: 109 mmol/L (ref 101–111)
Glucose, Bld: 142 mg/dL — ABNORMAL HIGH (ref 65–99)
Potassium: 4 mmol/L (ref 3.5–5.1)
SODIUM: 140 mmol/L (ref 135–145)

## 2016-03-02 LAB — CBC
HCT: 37.9 % (ref 35.0–47.0)
HEMOGLOBIN: 12.8 g/dL (ref 12.0–16.0)
MCH: 31.2 pg (ref 26.0–34.0)
MCHC: 33.9 g/dL (ref 32.0–36.0)
MCV: 92 fL (ref 80.0–100.0)
PLATELETS: 252 10*3/uL (ref 150–440)
RBC: 4.12 MIL/uL (ref 3.80–5.20)
RDW: 13.6 % (ref 11.5–14.5)
WBC: 5.2 10*3/uL (ref 3.6–11.0)

## 2016-03-02 LAB — TSH: TSH: 1.966 u[IU]/mL (ref 0.350–4.500)

## 2016-03-02 LAB — ECHOCARDIOGRAM COMPLETE
Height: 64 in
WEIGHTICAEL: 2256 [oz_av]

## 2016-03-02 MED ORDER — RIVAROXABAN 20 MG PO TABS: 20.0000 mg | ORAL_TABLET | Freq: Every day | ORAL | 2 refills | Status: AC

## 2016-03-02 MED ORDER — METOPROLOL TARTRATE 25 MG PO TABS: 25.0000 mg | ORAL_TABLET | Freq: Two times a day (BID) | ORAL | 0 refills | Status: DC

## 2016-03-02 NOTE — Progress Notes (Signed)
*  PRELIMINARY RESULTS* Echocardiogram 2D Echocardiogram has been performed.  Cristela BlueHege, Nikayla Madaris 03/02/2016, 2:57 PM

## 2016-03-02 NOTE — Discharge Instructions (Signed)
[facility] - [place] at [facility]  DIET:  Cardiac diet  DISCHARGE CONDITION:  Stable  ACTIVITY:  Activity as tolerated  OXYGEN:  Home Oxygen: No.   Oxygen Delivery: room air  DISCHARGE LOCATION:  home    ADDITIONAL DISCHARGE INSTRUCTION:   If you experience worsening of your admission symptoms, develop shortness of breath, life threatening emergency, suicidal or homicidal thoughts you must seek medical attention immediately by calling 911 or calling your MD immediately  if symptoms less severe.  You Must read complete instructions/literature along with all the possible adverse reactions/side effects for all the Medicines you take and that have been prescribed to you. Take any new Medicines after you have completely understood and accpet all the possible adverse reactions/side effects.   Please note  You were cared for by a hospitalist during your hospital stay. If you have any questions about your discharge medications or the care you received while you were in the hospital after you are discharged, you can call the unit and asked to speak with the hospitalist on call if the hospitalist that took care of you is not available. Once you are discharged, your primary care physician will handle any further medical issues. Please note that NO REFILLS for any discharge medications will be authorized once you are discharged, as it is imperative that you return to your primary care physician (or establish a relationship with a primary care physician if you do not have one) for your aftercare needs so that they can reassess your need for medications and monitor your lab values.  3

## 2016-03-02 NOTE — Evaluation (Signed)
Physical Therapy Evaluation Patient Details Name: Jennifer [name] MRN: [id number] DOB: December 20, 1931 Today's Date: 03/02/2016   History of Present Illness  Pt admitted for afib and weakness. Pt with history of HTN, arthritis, and hyperlipidemia.   Clinical Impression  Pt is a pleasant 82 year old female who was admitted for afib and weakness. Pt demonstrates all bed mobility/transfers/ambulation at baseline level. Pt independent with donning AFO and shoes. Has supportive family who assist with needs. Pt does not require any further PT needs at this time. Pt will be dc in house and does not require follow up. RN aware. Will dc current orders.      Follow Up Recommendations No PT follow up    Equipment Recommendations  None recommended by PT    Recommendations for Other Services       Precautions / Restrictions Precautions Precautions: Fall Restrictions Weight Bearing Restrictions: No      Mobility  Bed Mobility Overal bed mobility: Independent             General bed mobility comments: safe technique performed  Transfers Overall transfer level: Independent Equipment used: None             General transfer comment: transfers performed with safe technique. UPright posture noted, slight unsteadiness, needs HHA.   Ambulation/Gait Ambulation/Gait assistance: Supervision Ambulation Distance (Feet): 200 Feet Assistive device: Rolling walker (2 wheeled) Gait Pattern/deviations: Step-through pattern     General Gait Details: ambulated using RW and good speed. Reciprocal gait pattern performed with ability to navigate obstacles in hallway. No LOB noted  Stairs            Wheelchair Mobility    Modified Rankin (Stroke Patients Only)       Balance Overall balance assessment: Needs assistance Sitting-balance support: Feet supported Sitting balance-Leahy Scale: Normal     Standing balance support: Bilateral upper extremity supported Standing  balance-Leahy Scale: Good                               Pertinent Vitals/Pain Pain Assessment: No/denies pain    Home Living Family/patient expects to be discharged to:: Private residence Living Arrangements: Alone Available Help at Discharge: Family Type of Home: House         Home Equipment: Walker - 2 wheels      Prior Function Level of Independence: Independent         Comments: still drives, however does not own a car. Community ambulator. Grandaughter drives her to appointments     Hand Dominance        Extremity/Trunk Assessment   Upper Extremity Assessment Upper Extremity Assessment: Overall WFL for tasks assessed    Lower Extremity Assessment Lower Extremity Assessment: Overall WFL for tasks assessed (wears AFO on L foot from polio as a child)       Communication   Communication: No difficulties  Cognition Arousal/Alertness: Awake/alert Behavior During Therapy: WFL for tasks assessed/performed Overall Cognitive Status: Within Functional Limits for tasks assessed                      General Comments      Exercises     Assessment/Plan    PT Assessment Patent does not need any further PT services  PT Problem List            PT Treatment Interventions      PT Goals (Current goals can be found  in the Care Plan section)  Acute Rehab PT Goals Patient Stated Goal: to go home today PT Goal Formulation: All assessment and education complete, DC therapy Time For Goal Achievement: 03/02/16 Potential to Achieve Goals: Good    Frequency     Barriers to discharge        Co-evaluation               End of Session   Activity Tolerance: Patient tolerated treatment well Patient left: in chair (MF risk, doesn't require alarm) Nurse Communication: Mobility status    Functional Assessment Tool Used: clinical judgement Functional Limitation: Mobility: Walking and moving around Mobility: Walking and Moving Around  Current Status [phone](G8978): 0 percent impaired, limited or restricted Mobility: Walking and Moving Around Goal Status [phone](G8979): 0 percent impaired, limited or restricted Mobility: Walking and Moving Around Discharge Status [phone](G8980): 0 percent impaired, limited or restricted    Time: 1105-1120 PT Time Calculation (min) (ACUTE ONLY): 15 min   Charges:   PT Evaluation $PT Eval Low Complexity: 1 Procedure     PT G Codes:   PT G-Codes **NOT FOR INPATIENT CLASS** Functional Assessment Tool Used: clinical judgement Functional Limitation: Mobility: Walking and moving around Mobility: Walking and Moving Around Current Status ([id number](G8978): 0 percent impaired, limited or restricted Mobility: Walking and Moving Around Goal Status ([id number](G8979): 0 percent impaired, limited or restricted Mobility: Walking and Moving Around Discharge Status ([phone](G8980): 0 percent impaired, limited or restricted    Jaymen Fetch 03/02/2016, 11:56 AM  Elizabeth PalauStephanie Osby Sweetin, PT, DPT [phone]

## 2016-03-02 NOTE — Discharge Summary (Signed)
[facility] - [place] at [facility]  Jennifer [name], 82 y.o., DOB 04-Apr-1931, MRN [id number]. Admission date: 03/01/2016 Discharge Date 03/02/2016 Primary MD Mickey FarberHIES, DAVID, MD Admitting Physician Shaune PollackQing Chen, MD  Admission Diagnosis  Paroxysmal atrial fibrillation with rapid ventricular response [facility](HCC) [I48.0]  Discharge Diagnosis   Active Problems:   A-fib (HCC) with RVR   Generalized weakness  Hyperlipidemia unspecified   Osteoarthritis   essential hypertension         Hospital CoursePatsy Colette [name]  is a 82 y.o. female with a known history of Hypertension, arthritis and hyperlipidemia. The patient presently ED with the above chief complaint. The patient said she started to have a palpitation 2 days ago, which improved. However, she started to have palpitation and weakness again today. She denies any chest pain, orthopnea or nocturnal dyspnea or leg edema. She was found A. fib with RVR at 150s, and then self converted to normal sinus rhythm in the ED. ED physician discussed with on-call cardiologist, who suggested start Lopressor and xarelto, patient was placed on observation admitted to for Hospital for further evaluation. Her heart rate normalized but she continues to be in A. fib. She is being discharged on Lopressor and the Xarelto. She will have a echocardiogram prior to discharge. She'll follow up with cardiology outpatient for further evaluation.            Consults  None  Significant Tests:  See full reports for all details     Dg Chest 2 View  Result Date: 03/01/2016 CLINICAL DATA:  Weakness. Fluttering sensation in the chest this morning. EXAM: CHEST  2 VIEW COMPARISON:  04/02/2015.  Chest CTA dated 04/02/2015. FINDINGS: Normal sized heart. Tortuous aorta. Small amount of residual linear scarring in the left lower lobe. Otherwise, clear lungs. Thoracic spine degenerative changes. Diffuse osteopenia. Stable thoracolumbar vertebral compression  deformities. One again contains kyphoplasty material. IMPRESSION: No acute abnormality. Electronically Signed   By: Beckie SaltsSteven  Reid M.D.   On: 03/01/2016 16:00       Today   Subjective:   Jennifer [name]  Feeling better denies any chest pain or shortness of breath  Objective:   Blood pressure (!) 152/62, pulse (!) 50, temperature 98.1 F (36.7 C), temperature source Oral, resp. rate 16, height 5\' 4"  (1.626 m), weight 141 lb (64 kg), SpO2 97 %.  .  Intake/Output Summary (Last 24 hours) at 03/02/16 1521 Last data filed at 03/02/16 0957  Gross per 24 hour  Intake              240 ml  Output              500 ml  Net             -260 ml    Exam VITAL SIGNS: Blood pressure (!) 152/62, pulse (!) 50, temperature 98.1 F (36.7 C), temperature source Oral, resp. rate 16, height 5\' 4"  (1.626 m), weight 141 lb (64 kg), SpO2 97 %.  GENERAL:  82 y.o.-year-old patient lying in the bed with no acute distress.  EYES: Pupils equal, round, reactive to light and accommodation. No scleral icterus. Extraocular muscles intact.  HEENT: Head atraumatic, normocephalic. Oropharynx and nasopharynx clear.  NECK:  Supple, no jugular venous distention. No thyroid enlargement, no tenderness.  LUNGS: Normal breath sounds bilaterally, no wheezing, rales,rhonchi or crepitation. No use of accessory muscles of respiration.  CARDIOVASCULAR:Irregularly irregularl. No murmurs, rubs, or gallops.  ABDOMEN: Soft, nontender, nondistended. Bowel sounds present. No organomegaly or  mass.  EXTREMITIES: No pedal edema, cyanosis, or clubbing.  NEUROLOGIC: Cranial nerves II through XII are intact. Muscle strength 5/5 in all extremities. Sensation intact. Gait not checked.  PSYCHIATRIC: The patient is alert and oriented x 3.  SKIN: No obvious rash, lesion, or ulcer.   Data Review     CBC w Diff: Lab Results  Component Value Date   WBC 5.2 03/02/2016   HGB 12.8 03/02/2016   HCT 37.9 03/02/2016   PLT 252 03/02/2016   CMP: Lab  Results  Component Value Date   NA 140 03/02/2016   K 4.0 03/02/2016   CL 109 03/02/2016   CO2 26 03/02/2016   BUN 17 03/02/2016   CREATININE 0.66 03/02/2016  .  Micro Results No results found for this or any previous visit (from the past 240 hour(s)).      Code Status Orders        Start     Ordered   03/01/16 1900  Full code  Continuous     03/01/16 1859    Code Status History    Date Active Date Inactive Code Status Order ID Comments User Context   This patient has a current code status but no historical code status.          Follow-up Information    [facility]. Go on 03/19/2016.   Why:  at 10:30am       Mickey FarberHIES, DAVID, MD. Go on 03/17/2016.   Specialty:  Internal Medicine Why:  at 10:30am Contact information: [address] [facility] [place] [id number] [phone]           Discharge Medications   Allergies as of 03/02/2016   No Known Allergies     Medication List    TAKE these medications   felodipine 2.5 MG 24 hr tablet Commonly known as:  PLENDIL Take 1 tablet by mouth daily before breakfast.   ibuprofen 600 MG tablet Commonly known as:  ADVIL,MOTRIN Take 1 tablet (600 mg total) by mouth every 8 (eight) hours as needed for moderate pain (with food).   lisinopril 40 MG tablet Commonly known as:  PRINIVIL,ZESTRIL Take 1 tablet by mouth daily.   metoprolol tartrate 25 MG tablet Commonly known as:  LOPRESSOR Take 1 tablet (25 mg total) by mouth 2 (two) times daily.   omeprazole 20 MG capsule Commonly known as:  PRILOSEC Take 1 capsule by mouth 2 (two) times daily.   rivaroxaban 20 MG Tabs tablet Commonly known as:  XARELTO Take 1 tablet (20 mg total) by mouth daily with supper.   simvastatin 40 MG tablet Commonly known as:  ZOCOR Take 1 tablet by mouth daily.   Vitamin D (Ergocalciferol) 50000 units Caps capsule Commonly known as:  DRISDOL Take 1 capsule by mouth every 30 (thirty) days.           Total Time in preparing paper work, data evaluation and todays exam - 35 minutes  Auburn BilberryPATEL, Zamzam Whinery M.D on 03/02/2016 at 3:21 PM  [facility]   Office  [phone]

## 2016-03-02 NOTE — Progress Notes (Signed)
Patient is discharge home in a stable condition, summary and f/u care given to pt's and son, verbalized understanding , left with son.

## 2016-03-02 NOTE — Care Management (Signed)
Provided patient with coupon for 30 day trail of Xarelto.  she confirms that she has pharmacy coverage.  no other needs identified.  for discharge home today

## 2016-03-03 LAB — HEMOGLOBIN A1C
HEMOGLOBIN A1C: 6.6 % — AB (ref 4.8–5.6)
Mean Plasma Glucose: 143 mg/dL

## 2016-03-12 ENCOUNTER — Other Ambulatory Visit: Payer: Self-pay | Admitting: Internal Medicine

## 2016-03-12 DIAGNOSIS — I480 Paroxysmal atrial fibrillation: Secondary | ICD-10-CM

## 2016-03-12 DIAGNOSIS — R200 Anesthesia of skin: Secondary | ICD-10-CM

## 2016-03-12 DIAGNOSIS — M21371 Foot drop, right foot: Secondary | ICD-10-CM

## 2016-03-14 ENCOUNTER — Emergency Department: Payer: Medicare Other

## 2016-03-14 ENCOUNTER — Emergency Department
Admission: EM | Admit: 2016-03-14 | Discharge: 2016-03-14 | Disposition: A | Discharge status: Home / Self Care | Payer: Medicare Other | Attending: Student in an Organized Health Care Education/Training Program | Admitting: Student in an Organized Health Care Education/Training Program

## 2016-03-14 DIAGNOSIS — Y92009 Unspecified place in unspecified non-institutional (private) residence as the place of occurrence of the external cause: Secondary | ICD-10-CM | POA: Insufficient documentation

## 2016-03-14 DIAGNOSIS — Y939 Activity, unspecified: Secondary | ICD-10-CM | POA: Insufficient documentation

## 2016-03-14 DIAGNOSIS — R002 Palpitations: Secondary | ICD-10-CM | POA: Diagnosis not present

## 2016-03-14 DIAGNOSIS — I10 Essential (primary) hypertension: Secondary | ICD-10-CM | POA: Insufficient documentation

## 2016-03-14 DIAGNOSIS — W01198A Fall on same level from slipping, tripping and stumbling with subsequent striking against other object, initial encounter: Secondary | ICD-10-CM | POA: Diagnosis not present

## 2016-03-14 DIAGNOSIS — Y999 Unspecified external cause status: Secondary | ICD-10-CM | POA: Diagnosis not present

## 2016-03-14 DIAGNOSIS — S0083XA Contusion of other part of head, initial encounter: Secondary | ICD-10-CM | POA: Diagnosis not present

## 2016-03-14 DIAGNOSIS — Z79899 Other long term (current) drug therapy: Secondary | ICD-10-CM | POA: Diagnosis not present

## 2016-03-14 DIAGNOSIS — S0990XA Unspecified injury of head, initial encounter: Secondary | ICD-10-CM | POA: Diagnosis present

## 2016-03-14 LAB — BASIC METABOLIC PANEL
ANION GAP: 10 (ref 5–15)
BUN: 17 mg/dL (ref 6–20)
CHLORIDE: 108 mmol/L (ref 101–111)
CO2: 24 mmol/L (ref 22–32)
Calcium: 9.6 mg/dL (ref 8.9–10.3)
Creatinine, Ser: 0.57 mg/dL (ref 0.44–1.00)
GFR calc Af Amer: >60 mL/min (ref >60)
GLUCOSE: 186 mg/dL — AB (ref 65–99)
POTASSIUM: 3.4 mmol/L — AB (ref 3.5–5.1)
Sodium: 142 mmol/L (ref 135–145)

## 2016-03-14 LAB — PROTIME-INR
INR: 1.2
PROTHROMBIN TIME: 15.3 s — AB (ref 11.4–15.2)

## 2016-03-14 LAB — CBC
HEMATOCRIT: 42.1 % (ref 35.0–47.0)
HEMOGLOBIN: 14 g/dL (ref 12.0–16.0)
MCH: 31 pg (ref 26.0–34.0)
MCHC: 33.3 g/dL (ref 32.0–36.0)
MCV: 93.1 fL (ref 80.0–100.0)
Platelets: 260 10*3/uL (ref 150–440)
RBC: 4.52 MIL/uL (ref 3.80–5.20)
RDW: 13.9 % (ref 11.5–14.5)
WBC: 6.4 10*3/uL (ref 3.6–11.0)

## 2016-03-14 LAB — TROPONIN I
Troponin I: <0.03 ng/mL (ref <0.03)
Troponin I: <0.03 ng/mL (ref <0.03)

## 2016-03-14 NOTE — ED Provider Notes (Signed)
[facility] Emergency Department Provider Note    None    (approximate)  I have reviewed the triage vital signs and the nursing notes.   HISTORY  Chief Complaint Irregular Heart Beat    HPI Jennifer [name] is a 82 y.o. female  was recently admitted for A. fib and recently placed on Xarelto presents with palpitations that occurred this morning at 5 AM as well as concern for worsening ecchymosis from her forehead down to bilateral eyes status post a fall from standing and hitting her head on a counter on Wednesday. Denies any numbness or tingling. Denies any severe headache. Was seen by physician on Thursday. Denies any chest pain or shortness of breath. States that she awoke from sleep with palpitations, went to take her morning diltiazem and roughly 15-20 minutes after her palpitations and chest discomfort resolved. She denies any chest pain or shortness of breath.  Past Medical History:  Diagnosis Date  . Arthritis   . High cholesterol   . Hypertension   . Polio    Family History  Problem Relation Age of Onset  . Stroke Mother   . Heart attack Father   . Diabetes Sister   . Heart attack Sister   . Cancer Brother    Past Surgical History:  Procedure Laterality Date  . BACK SURGERY    . FEMUR FRACTURE SURGERY  2010  . HIP SURGERY Left    Patient Active Problem List   Diagnosis Date Noted  . A-fib (HCC) 03/01/2016      Prior to Admission medications   Medication Sig Start Date End Date Taking? Authorizing Provider  felodipine (PLENDIL) 2.5 MG 24 hr tablet Take 1 tablet by mouth daily before breakfast. 03/06/15   Historical Provider, MD  ibuprofen (ADVIL,MOTRIN) 600 MG tablet Take 1 tablet (600 mg total) by mouth every 8 (eight) hours as needed for moderate pain (with food). 03/01/15   Anne-Caroline Sharma CovertNorman, MD  lisinopril (PRINIVIL,ZESTRIL) 40 MG tablet Take 1 tablet by mouth daily. 02/25/15   Historical Provider, MD  metoprolol tartrate  (LOPRESSOR) 25 MG tablet Take 1 tablet (25 mg total) by mouth 2 (two) times daily. 03/02/16   Auburn BilberryShreyang Patel, MD  omeprazole (PRILOSEC) 20 MG capsule Take 1 capsule by mouth 2 (two) times daily. 03/08/15   Historical Provider, MD  rivaroxaban (XARELTO) 20 MG TABS tablet Take 1 tablet (20 mg total) by mouth daily with supper. 03/02/16   Auburn BilberryShreyang Patel, MD  simvastatin (ZOCOR) 40 MG tablet Take 1 tablet by mouth daily. 02/25/15   Historical Provider, MD  Vitamin D, Ergocalciferol, (DRISDOL) 50000 units CAPS capsule Take 1 capsule by mouth every 30 (thirty) days. 03/08/15   Historical Provider, MD    Allergies Patient has no known allergies.    Social History Social History  Substance Use Topics  . Smoking status: Never Smoker  . Smokeless tobacco: Never Used  . Alcohol use No    Review of Systems Patient denies headaches, rhinorrhea, blurry vision, numbness, shortness of breath, chest pain, edema, cough, abdominal pain, nausea, vomiting, diarrhea, dysuria, fevers, rashes or hallucinations unless otherwise stated above in HPI. ____________________________________________   PHYSICAL EXAM:  VITAL SIGNS: Vitals:   03/14/16 1007  BP: 112/66  Pulse: 60  Resp: 18  Temp: 98.1 F (36.7 C)    Constitutional: Alert and oriented. Well appearing and in no acute distress. Eyes: Conjunctivae are normal. PERRL. EOMI. Head: right forehead ecchymosis and swelling, bilateral infraorbital ecchymosis, no proptosis Nose:  No congestion/rhinnorhea. Mouth/Throat: Mucous membranes are moist.  Oropharynx non-erythematous. Neck: No stridor. Painless ROM. No cervical spine tenderness to palpation Hematological/Lymphatic/Immunilogical: No cervical lymphadenopathy. Cardiovascular: Normal rate, irregular rhythm. Grossly normal heart sounds.  Good peripheral circulation. Respiratory: Normal respiratory effort.  No retractions. Lungs CTAB. Gastrointestinal: Soft and nontender. No distention. No abdominal  bruits. No CVA tenderness. Genitourinary:  Musculoskeletal: No lower extremity tenderness nor edema.  No joint effusions. Neurologic:  Normal speech and language. No gross focal neurologic deficits are appreciated. No gait instability. Skin:  Skin is warm, dry and intact. No rash noted. Psychiatric: Mood and affect are normal. Speech and behavior are normal.  ____________________________________________   LABS (all labs ordered are listed, but only abnormal results are displayed)  Results for orders placed or performed during the hospital encounter of 03/14/16 (from the past 24 hour(s))  Basic metabolic panel     Status: Abnormal   Collection Time: 03/14/16 10:47 AM  Result Value Ref Range   Sodium 142 135 - 145 mmol/L   Potassium 3.4 (L) 3.5 - 5.1 mmol/L   Chloride 108 101 - 111 mmol/L   CO2 24 22 - 32 mmol/L   Glucose, Bld 186 (H) 65 - 99 mg/dL   BUN 17 6 - 20 mg/dL   Creatinine, Ser 1.610.57 0.44 - 1.00 mg/dL   Calcium 9.6 8.9 - 09.610.3 mg/dL   GFR calc non Af Amer >60 >60 mL/min   GFR calc Af Amer >60 >60 mL/min   Anion gap 10 5 - 15  CBC     Status: None   Collection Time: 03/14/16 10:47 AM  Result Value Ref Range   WBC 6.4 3.6 - 11.0 K/uL   RBC 4.52 3.80 - 5.20 MIL/uL   Hemoglobin 14.0 12.0 - 16.0 g/dL   HCT 04.542.1 40.935.0 - 81.147.0 %   MCV 93.1 80.0 - 100.0 fL   MCH 31.0 26.0 - 34.0 pg   MCHC 33.3 32.0 - 36.0 g/dL   RDW 91.413.9 78.211.5 - 95.614.5 %   Platelets 260 150 - 440 K/uL  Troponin I     Status: None   Collection Time: 03/14/16 10:47 AM  Result Value Ref Range   Troponin I <0.03 <0.03 ng/mL  Protime-INR     Status: Abnormal   Collection Time: 03/14/16 10:47 AM  Result Value Ref Range   Prothrombin Time 15.3 (H) 11.4 - 15.2 seconds   INR 1.20    ____________________________________________  EKG My review and personal interpretation at Time: 10:00   Indication: palpitations  Rate: 95  Rhythm: afib Axis: normal Other: no acute ischemic  changes ____________________________________________  RADIOLOGY  I personally reviewed all radiographic images ordered to evaluate for the above acute complaints and reviewed radiology reports and findings.  These findings were personally discussed with the patient.  Please see medical record for radiology report. ____________________________________________   PROCEDURES  Procedure(s) performed:  Procedures    Critical Care performed: no ____________________________________________   INITIAL IMPRESSION / ASSESSMENT AND PLAN / ED COURSE  Pertinent labs & imaging results that were available during my care of the patient were reviewed by me and considered in my medical decision making (see chart for details).  DDX: dysrhythmia, electrolye abn, chf, acs, sdh, iph  Jennifer [name] is a 82 y.o. who presents to the ED with palpitations earlier this morning. Patient arrives afebrile hemodynamic stable. She is currently in rate controlled A. fib. I do suspect the episode of palpitations this morning was secondary to RVR  that was controlled with her oral diltiazem. We will obtain a troponin that is 6 hours after onset of chest discomfort to further risk stratify for ACS. Initial troponin is negative. CT head ordered to evaluate for acute traumatic injury shows no evidence of subdural or parenchymal hemorrhage. Patient is otherwise he mechanically stable. Placed on monitor and continue to observe.  Clinical Course as of Mar 15 1523  Sat Mar 14, 2016  1500 Repeat troponin is negative.  Patient was able to tolerate PO and was able to ambulate with a steady gait.  Have discussed with the patient and available family all diagnostics and treatments performed thus far and all questions were answered to the best of my ability. The patient demonstrates understanding and agreement with plan.   [PR]    Clinical Course User Index [PR] Willy Eddy, MD      ____________________________________________   FINAL CLINICAL IMPRESSION(S) / ED DIAGNOSES  Final diagnoses:  Palpitations  Facial bruising, initial encounter      NEW MEDICATIONS STARTED DURING THIS VISIT:  New Prescriptions   No medications on file     Note:  This document was prepared using Dragon voice recognition software and may include unintentional dictation errors.    Willy Eddy, MD 03/14/16 [phone]

## 2016-03-14 NOTE — ED Notes (Signed)
Pt verbalized understanding of discharge instructions. NAD at this time. 

## 2016-03-14 NOTE — ED Triage Notes (Signed)
Pt was seen a week ago for a.fib, pt states that she was placed on a blood thinner and her primary care dr changed her medications, pt states that she fell and hit her head on Wednesday but wasn't seen for that, pt has bruising under her eyes bilat, pt states that she was awakened from her sleep this am with the sensation that her heart was racing

## 2016-03-19 ENCOUNTER — Ambulatory Visit: Payer: Medicare Other
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
## 2017-01-11 IMAGING — CR DG CHEST 1V PORT
1 series · 1 of 1 positions shown · non-contrast
Comparison: CT chest earlier today scratch the CT chest and single
view of the chest earlier today.

CLINICAL DATA: Left pneumothorax. Status post chest tube placement.

EXAM:
PORTABLE CHEST 1 VIEW

[ap]
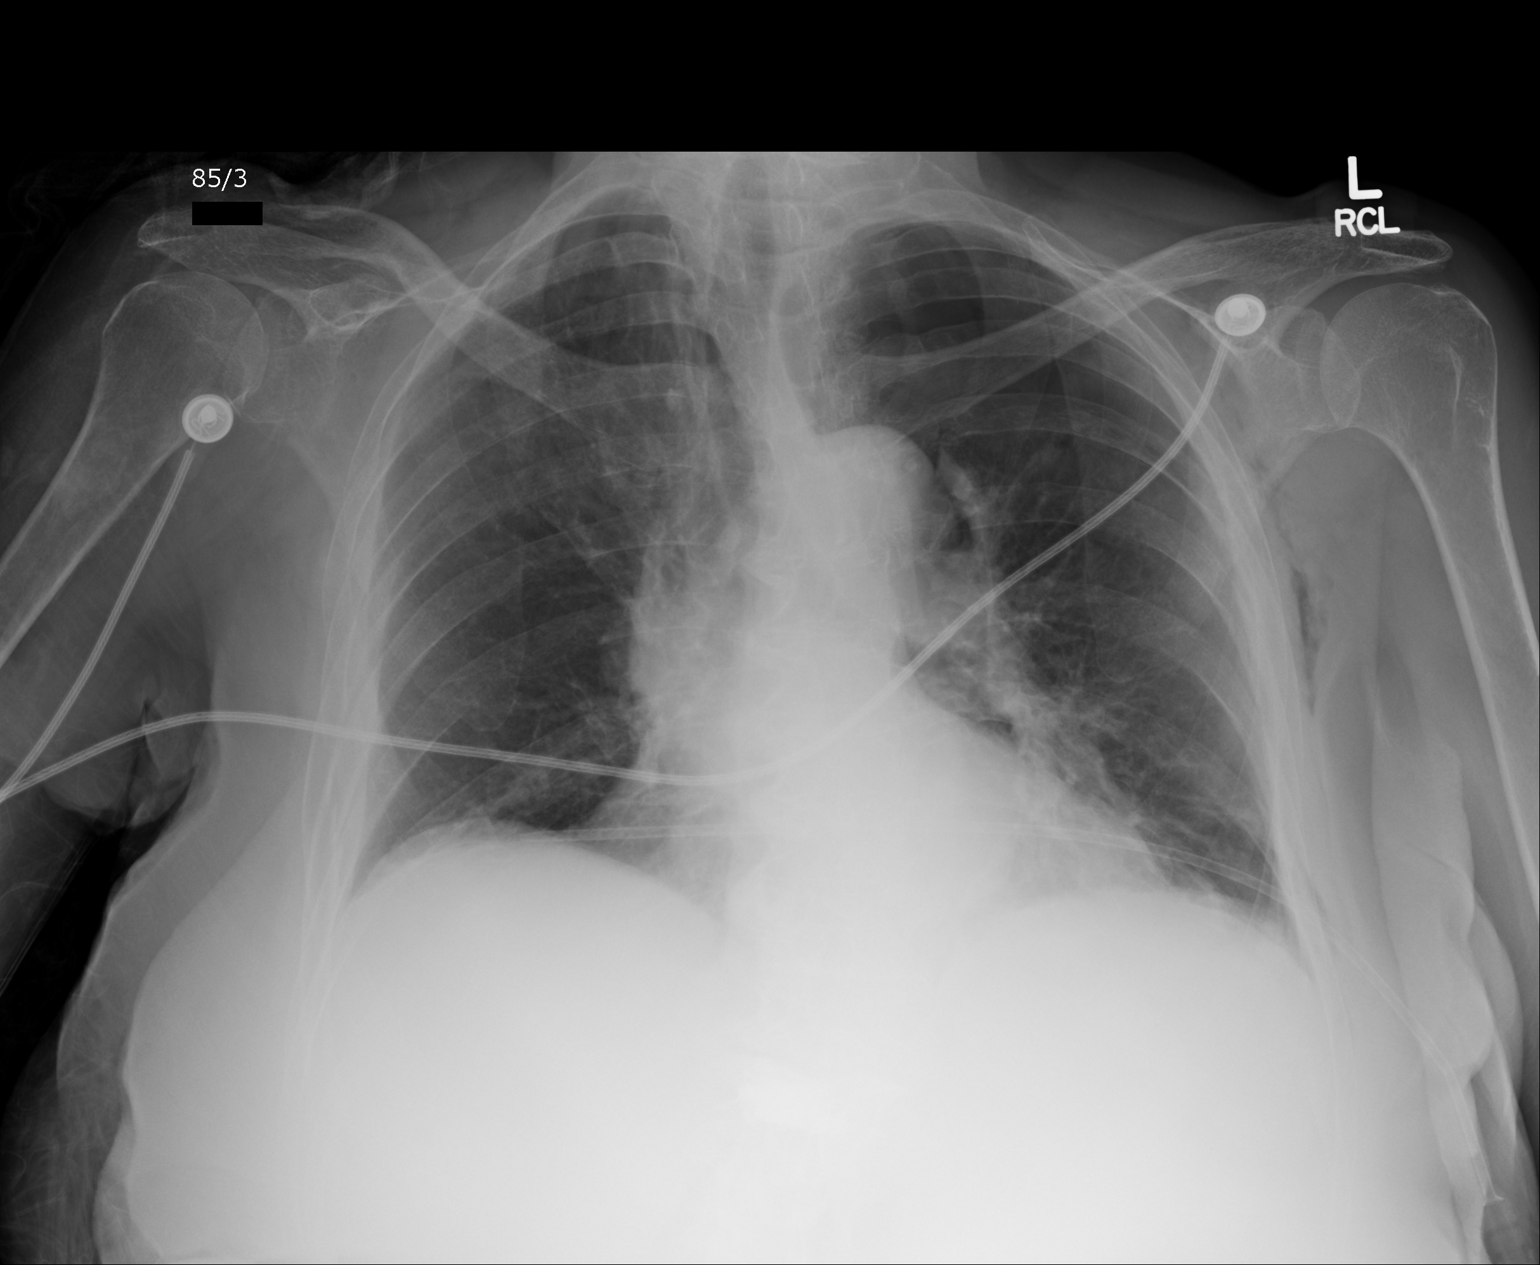

[1 of 1 positions shown; findings below may reference images not displayed]

FINDINGS: Small bore left chest tube crosses the midline with tip projecting
in the right lower lung zone. Left pneumothorax is again seen with
the apex of the left lung at approximately the posterior arc of the
left 6 rib. The right lung is clear. Heart size is normal.
IMPRESSION: Left chest tube crosses the midline with the tip projecting in the
right lung base. Left pneumothorax is unchanged.

Critical Value/emergent results were called by telephone at the time
of interpretation on 04/02/2015 at [DATE] to Dr. SHAMSIDEEN
DILFREDO , who verbally acknowledged these results.

## 2017-07-07 ENCOUNTER — Encounter: Payer: Self-pay | Admitting: Emergency Medicine

## 2017-07-07 ENCOUNTER — Other Ambulatory Visit: Payer: Self-pay

## 2017-07-07 ENCOUNTER — Emergency Department: Payer: Medicare Other

## 2017-07-07 ENCOUNTER — Emergency Department
Admission: EM | Admit: 2017-07-07 | Discharge: 2017-07-07 | Disposition: A | Discharge status: Home / Self Care | Payer: Medicare Other | Attending: Emergency Medicine | Admitting: Emergency Medicine

## 2017-07-07 DIAGNOSIS — R2241 Localized swelling, mass and lump, right lower limb: Secondary | ICD-10-CM | POA: Diagnosis present

## 2017-07-07 DIAGNOSIS — L03115 Cellulitis of right lower limb: Secondary | ICD-10-CM | POA: Insufficient documentation

## 2017-07-07 DIAGNOSIS — I10 Essential (primary) hypertension: Secondary | ICD-10-CM | POA: Insufficient documentation

## 2017-07-07 DIAGNOSIS — Z79899 Other long term (current) drug therapy: Secondary | ICD-10-CM | POA: Insufficient documentation

## 2017-07-07 DIAGNOSIS — R609 Edema, unspecified: Secondary | ICD-10-CM

## 2017-07-07 LAB — CBC WITH DIFFERENTIAL/PLATELET
Basophils Absolute: 0 10*3/uL (ref 0–0.1)
Basophils Relative: 1 %
Eosinophils Absolute: 0.1 10*3/uL (ref 0–0.7)
Eosinophils Relative: 1 %
HCT: 39.9 % (ref 35.0–47.0)
Hemoglobin: 13.5 g/dL (ref 12.0–16.0)
LYMPHS ABS: 1.1 10*3/uL (ref 1.0–3.6)
LYMPHS PCT: 17 %
MCH: 31.6 pg (ref 26.0–34.0)
MCHC: 33.8 g/dL (ref 32.0–36.0)
MCV: 93.5 fL (ref 80.0–100.0)
MONO ABS: 0.5 10*3/uL (ref 0.2–0.9)
Monocytes Relative: 8 %
Neutro Abs: 4.7 10*3/uL (ref 1.4–6.5)
Neutrophils Relative %: 73 %
Platelets: 273 10*3/uL (ref 150–440)
RBC: 4.27 MIL/uL (ref 3.80–5.20)
RDW: 13.7 % (ref 11.5–14.5)
WBC: 6.5 10*3/uL (ref 3.6–11.0)

## 2017-07-07 LAB — COMPREHENSIVE METABOLIC PANEL
ALT: 18 U/L (ref 14–54)
AST: 21 U/L (ref 15–41)
Albumin: 4.2 g/dL (ref 3.5–5.0)
Alkaline Phosphatase: 127 U/L — ABNORMAL HIGH (ref 38–126)
Anion gap: 5 (ref 5–15)
BUN: 22 mg/dL — ABNORMAL HIGH (ref 6–20)
CHLORIDE: 108 mmol/L (ref 101–111)
CO2: 27 mmol/L (ref 22–32)
Calcium: 9.5 mg/dL (ref 8.9–10.3)
Creatinine, Ser: 0.86 mg/dL (ref 0.44–1.00)
Glucose, Bld: 135 mg/dL — ABNORMAL HIGH (ref 65–99)
POTASSIUM: 4.1 mmol/L (ref 3.5–5.1)
Sodium: 140 mmol/L (ref 135–145)
Total Bilirubin: 0.3 mg/dL (ref 0.3–1.2)
Total Protein: 7.6 g/dL (ref 6.5–8.1)

## 2017-07-07 MED ORDER — CEPHALEXIN 500 MG PO CAPS
500.0000 mg | ORAL_CAPSULE | Freq: Once | ORAL | Status: AC
  Administered 2017-07-07: 500 mg via ORAL
  Filled 2017-07-07: qty 1

## 2017-07-07 MED ORDER — CEPHALEXIN 500 MG PO CAPS: 500.0000 mg | ORAL_CAPSULE | Freq: Four times a day (QID) | ORAL | 0 refills | Status: AC

## 2017-07-07 NOTE — ED Notes (Signed)

## 2017-07-07 NOTE — ED Provider Notes (Signed)
[facility] Emergency Department Provider Note  ____________________________________________   First MD Initiated Contact with Patient 07/07/17 1932     (approximate)  I have reviewed the triage vital signs and the nursing notes.   HISTORY  Chief Complaint Leg Swelling   HPI Jennifer [name] is a 84 y.o. female with a history of hypertension as well as atrial fibrillation on Xarelto who is presenting to the emergency department with right lower extremity swelling over the past 4 days.  She says that the swelling was at its worst to this past Sunday.  She says that the leg had also turned red.  However, she says the swelling goes down overnight.  Also with swelling to the left lower extremity.  Said that she previously had swelling in the past and was on Lasix but was taken off this by her primary care doctor.  Says that she has not had swelling in months to years and continues to be off of her Lasix.  Denies any fever.  Unable to obtain an appoint with her primary care doctor and so came to the emergency department for further evaluation.   Past Medical History:  Diagnosis Date  . Arthritis   . High cholesterol   . Hypertension   . Polio     Patient Active Problem List   Diagnosis Date Noted  . A-fib (HCC) 03/01/2016    Past Surgical History:  Procedure Laterality Date  . BACK SURGERY    . FEMUR FRACTURE SURGERY  2010  . HIP SURGERY Left     Prior to Admission medications   Medication Sig Start Date End Date Taking? Authorizing Provider  diltiazem (CARDIZEM CD) 180 MG 24 hr capsule Take 180 mg by mouth daily. 03/12/16   [provider]  ibuprofen (ADVIL,MOTRIN) 600 MG tablet Take 1 tablet (600 mg total) by mouth every 8 (eight) hours as needed for moderate pain (with food). Patient not taking: Reported on 03/14/2016 03/01/15   Rockne Menghini, MD  lisinopril (PRINIVIL,ZESTRIL) 40 MG tablet Take 1 tablet by mouth daily. 02/25/15    [provider]  metoprolol tartrate (LOPRESSOR) 25 MG tablet Take 1 tablet (25 mg total) by mouth 2 (two) times daily. Patient not taking: Reported on 03/14/2016 03/02/16   Auburn Bilberry, MD  Naproxen Sodium (ALEVE) 220 MG CAPS Take 220 mg by mouth daily as needed (pain).    [provider]  omeprazole (PRILOSEC) 20 MG capsule Take 1 capsule by mouth 2 (two) times daily. 03/08/15   [provider]  rivaroxaban (XARELTO) 20 MG TABS tablet Take 1 tablet (20 mg total) by mouth daily with supper. 03/02/16   Auburn Bilberry, MD  Vitamin D, Ergocalciferol, (DRISDOL) 50000 units CAPS capsule Take 1 capsule by mouth every 30 (thirty) days. 03/08/15   [provider]    Allergies Patient has no known allergies.  Family History  Problem Relation Age of Onset  . Stroke Mother   . Heart attack Father   . Diabetes Sister   . Heart attack Sister   . Cancer Brother     Social History Social History   Tobacco Use  . Smoking status: Never Smoker  . Smokeless tobacco: Never Used  Substance Use Topics  . Alcohol use: No  . Drug use: No    Review of Systems  Constitutional: No fever/chills Eyes: No visual changes. ENT: No sore throat. Cardiovascular: Denies chest pain. Respiratory: Denies shortness of breath. Gastrointestinal: No abdominal pain.  No  nausea, no vomiting.  No diarrhea.  No constipation. Genitourinary: Negative for dysuria. Musculoskeletal: Negative for back pain. Skin: As above Neurological: Negative for headaches, focal weakness or numbness.   ____________________________________________   PHYSICAL EXAM:  VITAL SIGNS: ED Triage Vitals [07/07/17 1742]  Enc Vitals Group     BP (!) 165/69     Pulse Rate (!) 105     Resp 16     Temp 98.4 F (36.9 C)     Temp Source Oral     SpO2 97 %     Weight 141 lb (64 kg)     Height      Head Circumference      Peak Flow      Pain Score 0     Pain Loc      Pain Edu?      Excl. in  GC?     Constitutional: Alert and oriented. Well appearing and in no acute distress. Eyes: Conjunctivae are normal.  Head: Atraumatic. Nose: No congestion/rhinnorhea. Mouth/Throat: Mucous membranes are moist.  Neck: No stridor.   Cardiovascular: Normal rate, regular rhythm. Grossly normal heart sounds.  Good peripheral circulation with equal and bilateral dorsalis pedis pulses. Respiratory: Normal respiratory effort.  No retractions. Lungs CTAB. Gastrointestinal: Soft and nontender. No distention. No CVA tenderness. Musculoskeletal: Moderate bilateral lower extremity edema from the ankles up to the proximal calves.  Right lower extremity slightly more edematous with erythema from the distal half of the calf and shin down to the ankle with increased warmth. Neurologic:  Normal speech and language. No gross focal neurologic deficits are appreciated. Skin:  Skin is warm, dry and intact. No rash noted. Psychiatric: Mood and affect are normal. Speech and behavior are normal.  ____________________________________________   LABS (all labs ordered are listed, but only abnormal results are displayed)  Labs Reviewed  COMPREHENSIVE METABOLIC PANEL - Abnormal; Notable for the following components:      Result Value   Glucose, Bld 135 (*)    BUN 22 (*)    Alkaline Phosphatase 127 (*)    All other components within normal limits  CBC WITH DIFFERENTIAL/PLATELET   ____________________________________________  EKG  ____________________________________________  RADIOLOGY  Right lower extremity venous ultrasound negative for DVT ____________________________________________   PROCEDURES  Procedure(s) performed:   Procedures  Critical Care performed:   ____________________________________________   INITIAL IMPRESSION / ASSESSMENT AND PLAN / ED COURSE  Pertinent labs & imaging results that were available during my care of the patient were reviewed by me and considered in my medical  decision making (see chart for details).  DDX: Cellulitis, peripheral edema, DVT, dependent edema, lymphedema As part of my medical decision making, I reviewed the following data within the electronic MEDICAL RECORD NUMBER Notes from prior ED visits  ----------------------------------------- 8:43 PM on 07/07/2017 -----------------------------------------  Patient without any distress at this time.  Reassuring blood work.  She has 3, 20 mg tabs of Lasix left over from her previous prescription.  She was previously prescribed these medications daily.  I advised her to take them daily over the next 3 days.  I will also start her on Keflex to cover for cellulitis because of the redness and warmth to the right lower externally.  She will be able to follow-up with her primary care doctor as an outpatient.  To be discharged at this time.  She is understanding of the diagnosis as well as treatment plan willing to comply. ____________________________________________   FINAL CLINICAL IMPRESSION(S) / ED DIAGNOSES  Peripheral edema.  Cellulitis of the right lower extremity.    NEW MEDICATIONS STARTED DURING THIS VISIT:  New Prescriptions   No medications on file     Note:  This document was prepared using Dragon voice recognition software and may include unintentional dictation errors.     Myrna BlazerSchaevitz, Miasia Crabtree Matthew, MD 07/07/17 [id number]

## 2017-07-07 NOTE — ED Triage Notes (Signed)
Sent by PCP to R/o dvt.  Swelling to RLE for about 1 week.  No pain per pt.  No warmth when felt. No fever. ambulatory.

## 2017-09-17 ENCOUNTER — Emergency Department
Admission: EM | Admit: 2017-09-17 | Discharge: 2017-09-18 | Disposition: A | Discharge status: Home / Self Care | Payer: Medicare Other | Attending: Emergency Medicine | Admitting: Emergency Medicine

## 2017-09-17 ENCOUNTER — Encounter: Payer: Self-pay | Admitting: Emergency Medicine

## 2017-09-17 ENCOUNTER — Emergency Department: Payer: Medicare Other

## 2017-09-17 DIAGNOSIS — Y939 Activity, unspecified: Secondary | ICD-10-CM | POA: Insufficient documentation

## 2017-09-17 DIAGNOSIS — I10 Essential (primary) hypertension: Secondary | ICD-10-CM | POA: Insufficient documentation

## 2017-09-17 DIAGNOSIS — I4891 Unspecified atrial fibrillation: Secondary | ICD-10-CM | POA: Diagnosis not present

## 2017-09-17 DIAGNOSIS — S728X1A Other fracture of right femur, initial encounter for closed fracture: Secondary | ICD-10-CM | POA: Diagnosis not present

## 2017-09-17 DIAGNOSIS — R262 Difficulty in walking, not elsewhere classified: Secondary | ICD-10-CM | POA: Diagnosis not present

## 2017-09-17 DIAGNOSIS — W010XXA Fall on same level from slipping, tripping and stumbling without subsequent striking against object, initial encounter: Secondary | ICD-10-CM | POA: Insufficient documentation

## 2017-09-17 DIAGNOSIS — Y998 Other external cause status: Secondary | ICD-10-CM | POA: Diagnosis not present

## 2017-09-17 DIAGNOSIS — S8991XA Unspecified injury of right lower leg, initial encounter: Secondary | ICD-10-CM | POA: Diagnosis present

## 2017-09-17 DIAGNOSIS — Z79899 Other long term (current) drug therapy: Secondary | ICD-10-CM | POA: Insufficient documentation

## 2017-09-17 DIAGNOSIS — S7291XA Unspecified fracture of right femur, initial encounter for closed fracture: Secondary | ICD-10-CM

## 2017-09-17 DIAGNOSIS — E7800 Pure hypercholesterolemia, unspecified: Secondary | ICD-10-CM | POA: Insufficient documentation

## 2017-09-17 DIAGNOSIS — Y929 Unspecified place or not applicable: Secondary | ICD-10-CM | POA: Insufficient documentation

## 2017-09-17 DIAGNOSIS — Z9181 History of falling: Secondary | ICD-10-CM | POA: Diagnosis not present

## 2017-09-17 HISTORY — DX: Unspecified atrial fibrillation: I48.91

## 2017-09-17 LAB — CBC WITH DIFFERENTIAL/PLATELET
BASOS ABS: 0 10*3/uL (ref 0–0.1)
BASOS PCT: 0 %
Eosinophils Absolute: 0 10*3/uL (ref 0–0.7)
Eosinophils Relative: 0 %
HEMATOCRIT: 40.6 % (ref 35.0–47.0)
HEMOGLOBIN: 13.7 g/dL (ref 12.0–16.0)
LYMPHS PCT: 10 %
Lymphs Abs: 0.9 10*3/uL — ABNORMAL LOW (ref 1.0–3.6)
MCH: 31.4 pg (ref 26.0–34.0)
MCHC: 33.8 g/dL (ref 32.0–36.0)
MCV: 93.1 fL (ref 80.0–100.0)
MONO ABS: 0.6 10*3/uL (ref 0.2–0.9)
Monocytes Relative: 7 %
NEUTROS ABS: 7.6 10*3/uL — AB (ref 1.4–6.5)
NEUTROS PCT: 83 %
Platelets: 238 10*3/uL (ref 150–440)
RBC: 4.37 MIL/uL (ref 3.80–5.20)
RDW: 14.2 % (ref 11.5–14.5)
WBC: 9.1 10*3/uL (ref 3.6–11.0)

## 2017-09-17 LAB — PROTIME-INR
INR: 1.6
Prothrombin Time: 18.9 seconds — ABNORMAL HIGH (ref 11.4–15.2)

## 2017-09-17 LAB — BASIC METABOLIC PANEL
ANION GAP: 10 (ref 5–15)
BUN: 23 mg/dL (ref 8–23)
CALCIUM: 9.6 mg/dL (ref 8.9–10.3)
CO2: 22 mmol/L (ref 22–32)
Chloride: 110 mmol/L (ref 98–111)
Creatinine, Ser: 0.66 mg/dL (ref 0.44–1.00)
GLUCOSE: 177 mg/dL — AB (ref 70–99)
POTASSIUM: 3.9 mmol/L (ref 3.5–5.1)
SODIUM: 142 mmol/L (ref 135–145)

## 2017-09-17 MED ORDER — ACETAMINOPHEN 325 MG PO TABS
650.0000 mg | ORAL_TABLET | Freq: Once | ORAL | Status: AC
  Administered 2017-09-17: 650 mg via ORAL
  Filled 2017-09-17: qty 2

## 2017-09-17 MED ORDER — LISINOPRIL 10 MG PO TABS
40.0000 mg | ORAL_TABLET | Freq: Every day | ORAL | Status: DC
  Administered 2017-09-18: 40 mg via ORAL
  Filled 2017-09-17 (×2): qty 4

## 2017-09-17 MED ORDER — IBUPROFEN 600 MG PO TABS: 600.0000 mg | ORAL_TABLET | Freq: Three times a day (TID) | ORAL | Status: DC | PRN

## 2017-09-17 MED ORDER — RIVAROXABAN 15 MG PO TABS
ORAL_TABLET | ORAL | Status: AC
  Filled 2017-09-17: qty 1

## 2017-09-17 MED ORDER — METOPROLOL TARTRATE 25 MG PO TABS
25.0000 mg | ORAL_TABLET | Freq: Two times a day (BID) | ORAL | Status: DC
  Administered 2017-09-18: 25 mg via ORAL
  Filled 2017-09-17 (×2): qty 1

## 2017-09-17 MED ORDER — ACETAMINOPHEN 500 MG PO TABS
500.0000 mg | ORAL_TABLET | Freq: Four times a day (QID) | ORAL | Status: DC | PRN
  Administered 2017-09-17 – 2017-09-18 (×2): 500 mg via ORAL
  Filled 2017-09-17 (×2): qty 1

## 2017-09-17 MED ORDER — PANTOPRAZOLE SODIUM 40 MG PO TBEC
40.0000 mg | DELAYED_RELEASE_TABLET | Freq: Every day | ORAL | Status: DC
  Administered 2017-09-18: 40 mg via ORAL
  Filled 2017-09-17 (×2): qty 1

## 2017-09-17 MED ORDER — CYANOCOBALAMIN 1000 MCG/ML IJ SOLN: 1000.0000 ug | INTRAMUSCULAR | Status: DC

## 2017-09-17 MED ORDER — SIMVASTATIN 10 MG PO TABS
10.0000 mg | ORAL_TABLET | Freq: Every day | ORAL | Status: DC
  Administered 2017-09-17: 10 mg via ORAL
  Filled 2017-09-17: qty 1

## 2017-09-17 MED ORDER — DILTIAZEM HCL ER COATED BEADS 240 MG PO CP24
240.0000 mg | ORAL_CAPSULE | Freq: Every day | ORAL | Status: DC
  Administered 2017-09-18: 240 mg via ORAL
  Filled 2017-09-17 (×2): qty 1

## 2017-09-17 MED ORDER — VITAMIN D (ERGOCALCIFEROL) 1.25 MG (50000 UNIT) PO CAPS: 50000.0000 [IU] | ORAL_CAPSULE | ORAL | Status: DC

## 2017-09-17 MED ORDER — RIVAROXABAN 15 MG PO TABS
15.0000 mg | ORAL_TABLET | Freq: Every day | ORAL | Status: DC
  Administered 2017-09-17: 15 mg via ORAL

## 2017-09-17 NOTE — ED Notes (Signed)
Pt lav and light green sent at this time.

## 2017-09-17 NOTE — ED Triage Notes (Signed)
Patient presents to the ED post fall this morning.  Patient states she was taking her trash can out to the road when she tripped and fell.  Patient fell onto her knees and her right knee appears significantly swollen.  Patient takes xarelto. Patient denies hitting her head.

## 2017-09-17 NOTE — ED Notes (Signed)
Pt with speaking with social work at this time with family.

## 2017-09-17 NOTE — ED Notes (Signed)
Pt repositioned in bed. Pt denies pain or needs at this time. Call bell within reach. Will continue to monitor.

## 2017-09-17 NOTE — ED Notes (Signed)
Pt states she only takes her simvastatin and xarelto at night and she takes all of her other medications in the morning. Pt given warm blanket and readjusted in bed. Call light within reach, will continue to monitor.

## 2017-09-17 NOTE — ED Notes (Addendum)
Kalman Shaneresa Dyar, daughter, [phone]

## 2017-09-17 NOTE — ED Notes (Signed)
Pt back from CT at this time. Denies any needs. Will continue to monitor. Family at bedside.

## 2017-09-17 NOTE — Clinical Social Work Note (Signed)
Clinical Social Work Assessment  Patient Details  Name: Jennifer [name] MRN: [id number] Date of Birth: Jun 21, 1931  Date of referral:  09/17/17               Reason for consult:  Facility Placement                Permission sought to share information with:  Facility Medical sales representativeContact Representative, Family Supports Permission granted to share information::  Yes, Verbal Permission Granted  Name::     Daughter in [place]  Agency::  All facilities  Relationship::     Contact Information:     Housing/Transportation Living arrangements for the past 2 months:  Single Family Home Source of Information:    Patient Interpreter Needed:  None Criminal Activity/Legal Involvement Pertinent to Current Situation/Hospitalization:  No - Comment as needed Significant Relationships:  Adult Children Lives with:  Self Do you feel safe going back to the place where you live?  Yes(With support from my friends) Need for family participation in patient care:  Yes (Comment)  Care giving concerns:  Daughter in law is a source of supports. Patient is her mother in law who recently lost her only son and DIL lost her husband.     Social Worker assessment / plan: LCSW introduced myself to 84 year old female oriented x4. Obtained verbal consent to speak to facilities and family. Patient has no use of her left leg ( She has polio since age [age] she reports she will be able to get aroung with wheel chair) and has a preference to go to SNF. LCSW will transfer patient to various facilities. Patient is continent. Patient is able to do most ADL tasks. Her insurance is Administrator, [employer]/ tri-care. LCSW offered refreshments and warn blankets ( [facility] LCSW will complete Fl2 and assessment and forward info on the patients behalf- No further needs at this time.  Employment status:  Retired Health and safety inspectornsurance information:  (S) Medicare(Tricare secondary) PT Recommendations:  No Follow Up Information / Referral to community resources:   Skilled Nursing Facility  Patient/Family's Response to care: Patient was informed and her DIL stated she does almost everything herself  Patient/Family's Understanding of and Emotional Response to Diagnosis, Current Treatment, and Prognosis: Patient has a good understanding of her current helf situation.  Emotional Assessment Appearance:  Appears stated age Attitude/Demeanor/Rapport:  Gracious Affect (typically observed):  Accepting, Appropriate, Pleasant Orientation:  Oriented to Self, Oriented to Situation, Oriented to Place, Oriented to  Time Alcohol / Substance use:  Not Applicable Psych involvement (Current and /or in the community):  No (Comment)  Discharge Needs  Concerns to be addressed:  No discharge needs identified Readmission within the last 30 days:  No Current discharge risk:  None Barriers to Discharge:  No Barriers Identified   Cheron SchaumannBandi, Dona Walby M, LCSW 09/17/2017, 3:32 PM

## 2017-09-17 NOTE — Progress Notes (Signed)
LCSW completed assessment and Fl2 started. Patient information was sent out to facilities if no beds available til Monday she will attempt to return home with in home supports.   Delta Air LinesClaudine Gopal Malter LCSW [phone]

## 2017-09-17 NOTE — NC FL2 (Signed)
[place] LEVEL OF CARE SCREENING TOOL     IDENTIFICATION  Patient Name: Jennifer [name] Birthdate: 1932-03-11 Sex: female Admission Date (Current Location): 09/17/2017  [place] and [place] Number:  Engineering geologist and Address:  [facility], [address] [place], [place] [address]      Provider Number: [id number]  Attending Physician Name and Address:  Arta Silence, MD  Relative Name and Phone Number:       Current Level of Care: Hospital Recommended Level of Care: [place] Prior Approval Number:    Date Approved/Denied:   PASRR Number: [id number]  Discharge Plan: SNF    Current Diagnoses: Patient Active Problem List   Diagnosis Date Noted  . A-fib ([place]) 03/01/2016    Orientation RESPIRATION BLADDER Height & Weight     Self, Time, Situation, Place  Normal Continent Weight: 135 lb (61.2 kg) Height:  _0  (149.9 cm)  BEHAVIORAL SYMPTOMS/MOOD NEUROLOGICAL BOWEL NUTRITION STATUS      Continent    AMBULATORY STATUS COMMUNICATION OF NEEDS Skin   Limited Assist Verbally Normal                       Personal Care Assistance Level of Assistance  Feeding   Feeding assistance: Independent       Functional Limitations Info  Sight, Hearing, Speech Sight Info: Adequate(eye glasses) Hearing Info: Adequate Speech Info: Adequate    SPECIAL CARE FACTORS FREQUENCY  (Unable to walk on broken leg ( Polio in other))                    Contractures Contractures Info: Present(Polio left leg)    Additional Factors Info  Code Status, Allergies Code Status Info: Full code Allergies Info: KNA NONE           Current Medications (09/17/2017):  This is the current hospital active medication list No current facility-administered medications for this encounter.    Current Outpatient Medications  Medication Sig Dispense Refill  . acetaminophen (TYLENOL) 500 MG tablet Take 500 mg by  mouth every 6 (six) hours as needed for pain.     . Cyanocobalamin (B-12 COMPLIANCE INJECTION) 1000 MCG/ML KIT Inject 1 Dose as directed daily.    Marland Kitchen diltiazem (CARDIZEM CD) 240 MG 24 hr capsule Take 240 mg by mouth daily.    Marland Kitchen lisinopril (PRINIVIL,ZESTRIL) 40 MG tablet Take 1 tablet by mouth daily.    Marland Kitchen omeprazole (PRILOSEC) 20 MG capsule Take 1 capsule by mouth 2 (two) times daily.    . rivaroxaban (XARELTO) 20 MG TABS tablet Take 1 tablet (20 mg total) by mouth daily with supper. 30 tablet 2  . simvastatin (ZOCOR) 10 MG tablet Take 10 mg by mouth at bedtime.    . Vitamin D, Ergocalciferol, (DRISDOL) 50000 units CAPS capsule Take 1 capsule by mouth every 30 (thirty) days.    Marland Kitchen ibuprofen (ADVIL,MOTRIN) 600 MG tablet Take 1 tablet (600 mg total) by mouth every 8 (eight) hours as needed for moderate pain (with food). (Patient not taking: Reported on 03/14/2016) 20 tablet 0  . metoprolol tartrate (LOPRESSOR) 25 MG tablet Take 1 tablet (25 mg total) by mouth 2 (two) times daily. (Patient not taking: Reported on 03/14/2016) 60 tablet 0     Discharge Medications: Please see discharge summary for a list of discharge medications.  Relevant Imaging Results:  Relevant Lab Results:   Additional Information SSN [id number]  Joana Reamer, [place]

## 2017-09-17 NOTE — ED Provider Notes (Signed)
[facility] Emergency Department Provider Note ____________________________________________   First MD Initiated Contact with Patient 09/17/17 1032     (approximate)  I have reviewed the triage vital signs and the nursing notes.   HISTORY  Chief Complaint Fall    HPI Jennifer [name] is a 84 y.o. female with PMH as noted below who presents with right knee injury, acute onset within the last few hours, when the patient had a mechanical fall and fell directly onto the knee.  She denies head injury or other acute injuries.  Patient is on Xarelto for atrial fibrillation.   Past Medical History:  Diagnosis Date  . Arthritis   . Atrial fibrillation ([place])   . High cholesterol   . Hypertension   . Polio     Patient Active Problem List   Diagnosis Date Noted  . A-fib ([place]) 03/01/2016    Past Surgical History:  Procedure Laterality Date  . BACK SURGERY    . FEMUR FRACTURE SURGERY  2010  . HIP SURGERY Left     Prior to Admission medications   Medication Sig Start Date End Date Taking? Authorizing Provider  acetaminophen (TYLENOL) 500 MG tablet Take 500 mg by mouth every 6 (six) hours as needed for pain.    Yes [provider]  Cyanocobalamin (B-12 COMPLIANCE INJECTION) 1000 MCG/ML KIT Inject 1 Dose as directed daily.   Yes [provider]  diltiazem (CARDIZEM CD) 240 MG 24 hr capsule Take 240 mg by mouth daily. 09/15/17  Yes [provider]  lisinopril (PRINIVIL,ZESTRIL) 40 MG tablet Take 1 tablet by mouth daily. 02/25/15  Yes [provider]  omeprazole (PRILOSEC) 20 MG capsule Take 1 capsule by mouth 2 (two) times daily. 03/08/15  Yes [provider]  rivaroxaban (XARELTO) 20 MG TABS tablet Take 1 tablet (20 mg total) by mouth daily with supper. 03/02/16  Yes Dustin Flock, MD  simvastatin (ZOCOR) 10 MG tablet Take 10 mg by mouth at bedtime. 09/11/17  Yes [provider]  Vitamin D,  Ergocalciferol, (DRISDOL) 50000 units CAPS capsule Take 1 capsule by mouth every 30 (thirty) days. 03/08/15  Yes [provider]  ibuprofen (ADVIL,MOTRIN) 600 MG tablet Take 1 tablet (600 mg total) by mouth every 8 (eight) hours as needed for moderate pain (with food). Patient not taking: Reported on 03/14/2016 03/01/15   Eula Listen, MD  metoprolol tartrate (LOPRESSOR) 25 MG tablet Take 1 tablet (25 mg total) by mouth 2 (two) times daily. Patient not taking: Reported on 03/14/2016 03/02/16   Dustin Flock, MD    Allergies Patient has no known allergies.  Family History  Problem Relation Age of Onset  . Stroke Mother   . Heart attack Father   . Diabetes Sister   . Heart attack Sister   . Cancer Brother     Social History Social History   Tobacco Use  . Smoking status: Never Smoker  . Smokeless tobacco: Never Used  Substance Use Topics  . Alcohol use: No  . Drug use: No    Review of Systems  Constitutional: No fever. Eyes: No redness. ENT: No neck pain. Cardiovascular: Denies chest pain. Respiratory: Denies shortness of breath. Gastrointestinal: No abdominal pain Genitourinary: Negative for flank pain.  Musculoskeletal: Negative for back pain.  Positive for right knee injury. Skin: Positive for abrasion. Neurological: Negative for headache.   ____________________________________________   PHYSICAL EXAM:  VITAL SIGNS: ED Triage Vitals  Enc Vitals Group     BP  09/17/17 0955 124/75     Pulse Rate 09/17/17 0955 76     Resp 09/17/17 0955 18     Temp 09/17/17 0955 97.8 F (36.6 C)     Temp Source 09/17/17 0955 Oral     SpO2 09/17/17 0955 99 %     Weight 09/17/17 0956 135 lb (61.2 kg)     Height 09/17/17 0956 _0  (1.499 m)     Head Circumference --      Peak Flow --      Pain Score 09/17/17 0956 10     Pain Loc --      Pain Edu? --      Excl. in [place]? --     Constitutional: Alert and oriented. Well appearing and in no acute  distress. Eyes: Conjunctivae are normal.  Head: Atraumatic. Nose: No congestion/rhinnorhea. Mouth/Throat: Mucous membranes are moist.   Neck: Normal range of motion.  Cardiovascular:  Good peripheral circulation. Respiratory: Normal respiratory effort.  Gastrointestinal: Soft and nontender. No distention.  Genitourinary: No flank tenderness. Musculoskeletal: No lower extremity edema.  Extremities warm and well perfused.  Chronic atrophy to bilateral lower extremities.Tenderness to right knee with pain on range of motion, and inability to flex past 90 degrees.  No deformity. Neurologic: Motor and sensory intact to bilateral lower extremities. Skin:  Skin is warm and dry.  2 cm superficial abrasion to anterior right knee. Psychiatric: Mood and affect are normal. Speech and behavior are normal.  ____________________________________________   LABS (all labs ordered are listed, but only abnormal results are displayed)  Labs Reviewed  BASIC METABOLIC PANEL - Abnormal; Notable for the following components:      Result Value   Glucose, Bld 177 (*)    All other components within normal limits  CBC WITH DIFFERENTIAL/PLATELET - Abnormal; Notable for the following components:   Neutro Abs 7.6 (*)    Lymphs Abs 0.9 (*)    All other components within normal limits  PROTIME-INR - Abnormal; Notable for the following components:   Prothrombin Time 18.9 (*)    All other components within normal limits   ____________________________________________  EKG  ED ECG REPORT I, Arta Silence, the attending physician, personally viewed and interpreted this ECG.  Date: 09/17/2017 EKG Time: 1301 Rate: 61 Rhythm: normal sinus rhythm QRS Axis: Left axis Intervals: normal ST/T Wave abnormalities: normal Narrative Interpretation: no evidence of acute ischemia  ____________________________________________  RADIOLOGY  XR right knee: Possible acute fracture of distal femur CT right knee: Acute  nondisplaced fracture of the distal right femur  ____________________________________________   PROCEDURES  Procedure(s) performed: No  Procedures  Critical Care performed: No ____________________________________________   INITIAL IMPRESSION / ASSESSMENT AND PLAN / ED COURSE  Pertinent labs & imaging results that were available during my care of the patient were reviewed by me and considered in my medical decision making (see chart for details).  84 year old female with PMH as noted above presents with right knee injury after mechanical fall.  Patient denies hitting her head or any other injuries.  On exam, the right lower extremity is neuro/vascular intact.  X-ray revealed possible acute fracture, so I obtained a CT.  The patient had prior ORIF to the distal right femur after a previous fracture.  CT does confirm findings consistent with acute fracture.  I consulted Dr. Posey Pronto from orthopedics who will come to evaluate the patient.  ----------------------------------------- 3:49 PM on 09/17/2017 -----------------------------------------  Dr. Posey Pronto evaluated the patient and advises that the fracture is  nonoperative.  He recommends a knee immobilizer and weightbearing as tolerated.  However, because of the patient's polio and chronic left-sided weakness she is unable to ambulate in her current state.  She will require placement likely in rehab.  I discussed the patient with hospitalist, however at this time, there is no criteria to admit the patient.  I discussed the results of the work-up and the plan of care with the patient.  I consulted the social worker to evaluate the patient for placement.  I signed the patient out to the oncoming physician Dr. Jimmye Norman.  ____________________________________________   FINAL CLINICAL IMPRESSION(S) / ED DIAGNOSES  Final diagnoses:  Closed fracture of right femur, unspecified fracture morphology, unspecified portion of femur, initial  encounter ([place])      NEW MEDICATIONS STARTED DURING THIS VISIT:  New Prescriptions   No medications on file     Note:  This document was prepared using Dragon voice recognition software and may include unintentional dictation errors.    Arta Silence, MD 09/17/17 [phone]

## 2017-09-17 NOTE — Consult Note (Signed)
ORTHOPAEDIC CONSULTATION  REQUESTING PHYSICIAN: Arta Silence, MD  Chief Complaint:   R knee pain  History of Present Illness: Jennifer [name] is a 84 y.o. female who had a fall at home earlier today.  The patient noted immediate knee pain. The patient lives independently.  Pain is described as sharp at its worst and a dull ache at its best.  Pain is rated a 10 out of 10 in severity.  Pain is improved with rest and immobilization.  Pain is worse with knee flexion beyond 50 degrees.  Imaging in the emergency department show a right minimally-displaced medial femoral condyle fracture. Of note, patient had an ORIF of R distal femur performed ~8 years ago by Dr. Mack Guise. She also had polio and her LLE is unable to bear her full weight and she has no PF/DF of her L ankle.  Past Medical History:  Diagnosis Date  . Arthritis   . Atrial fibrillation ([place])   . High cholesterol   . Hypertension   . Polio    Past Surgical History:  Procedure Laterality Date  . BACK SURGERY    . FEMUR FRACTURE SURGERY  2010  . HIP SURGERY Left    Social History   Socioeconomic History  . Marital status: Widowed    Spouse name: Not on file  . Number of children: Not on file  . Years of education: Not on file  . Highest education level: Not on file  Occupational History  . Not on file  Social Needs  . Financial resource strain: Not on file  . Food insecurity:    Worry: Not on file    Inability: Not on file  . Transportation needs:    Medical: Not on file    Non-medical: Not on file  Tobacco Use  . Smoking status: Never Smoker  . Smokeless tobacco: Never Used  Substance and Sexual Activity  . Alcohol use: No  . Drug use: No  . Sexual activity: Not on file  Lifestyle  . Physical activity:    Days per week: Not on file    Minutes per session: Not on file  . Stress: Not on file  Relationships  . Social connections:     Talks on phone: Not on file    Gets together: Not on file    Attends religious service: Not on file    Active member of club or organization: Not on file    Attends meetings of clubs or organizations: Not on file    Relationship status: Not on file  Other Topics Concern  . Not on file  Social History Narrative  . Not on file   Family History  Problem Relation Age of Onset  . Stroke Mother   . Heart attack Father   . Diabetes Sister   . Heart attack Sister   . Cancer Brother    No Known Allergies Prior to Admission medications   Medication Sig Start Date End Date Taking? Authorizing Provider  acetaminophen (TYLENOL) 500 MG tablet Take 500 mg by mouth every 6 (six) hours as needed for pain.    Yes [provider]  Cyanocobalamin (B-12 COMPLIANCE INJECTION) 1000 MCG/ML KIT Inject 1 Dose as directed daily.   Yes [provider]  diltiazem (CARDIZEM CD) 240 MG 24 hr capsule Take 240 mg by mouth daily. 09/15/17  Yes [provider]  lisinopril (PRINIVIL,ZESTRIL) 40 MG tablet Take 1 tablet by mouth daily. 02/25/15  Yes [provider]  omeprazole ([place])  20 MG capsule Take 1 capsule by mouth 2 (two) times daily. 03/08/15  Yes [provider]  rivaroxaban (XARELTO) 20 MG TABS tablet Take 1 tablet (20 mg total) by mouth daily with supper. 03/02/16  Yes Dustin Flock, MD  simvastatin (ZOCOR) 10 MG tablet Take 10 mg by mouth at bedtime. 09/11/17  Yes [provider]  Vitamin D, Ergocalciferol, (DRISDOL) 50000 units CAPS capsule Take 1 capsule by mouth every 30 (thirty) days. 03/08/15  Yes [provider]  ibuprofen (ADVIL,MOTRIN) 600 MG tablet Take 1 tablet (600 mg total) by mouth every 8 (eight) hours as needed for moderate pain (with food). Patient not taking: Reported on 03/14/2016 03/01/15   Eula Listen, MD  metoprolol tartrate (LOPRESSOR) 25 MG tablet Take 1 tablet (25 mg total) by mouth 2 (two) times daily. Patient  not taking: Reported on 03/14/2016 03/02/16   Dustin Flock, MD   Recent Labs    09/17/17 1037  WBC 9.1  HGB 13.7  HCT 40.6  PLT 238  K 3.9  CL 110  CO2 22  BUN 23  CREATININE 0.66  GLUCOSE 177*  CALCIUM 9.6  INR 1.60   Ct Knee Right Wo Contrast  Result Date: 09/17/2017 CLINICAL DATA:  Possible fracture, fall this morning. Prior knee surgery. EXAM: CT OF THE right KNEE WITHOUT CONTRAST TECHNIQUE: Multidetector CT imaging of the right knee was performed according to the standard protocol. Multiplanar CT image reconstructions were also generated. COMPARISON:  Multiple exams, including 09/17/2017 conventional radiographs FINDINGS: Bones/Joint/Cartilage Relatively nondisplaced fracture of the medial metaphysis of the distal femur extends vertically as on image 66/5 along the posterior cortical margin towards the medial condylar surface and intercondylar notch. The distal margin of this acute fracture is poorly seen. Complicating the assessment is deformity related to an old fracture in this region, which is probably contributing to some of the mild articular irregularity for example on images 58-60 of series 5 along the medial femoral condyle. There is also notable marginal spurring which is contributory. Along the anterior cortex, there is some chronic deformity as on image 155/4 likely along with some acute fracture component as well. Lipohemarthrosis. Distal femoral plate and screw fixator, with the fracture plane along or screening the margins of the tips of the transverse screws, for example on image 152/4. Ligaments Suboptimally assessed by CT. Muscles and Tendons Considerable muscular atrophy of the vastus lateralis, vastus intermedius, rectus femoris, biceps femoris, and semimembranous musculature. Severe atrophy of the calf musculature. Soft tissues Unremarkable IMPRESSION: 1. Nondisplaced fracture of the distal femur extending from the medial metaphysis vertically towards the medial  condylar surface. The distal portion of the fracture is indistinct. Lipohemarthrosis is present. Assessment somewhat complicated by an old fracture in the same region, leading to some articular surface irregularity which likely has a chronic component. As best it can be seen, the acute fracture seems to skirt the margin of the tips of the existing screws. 2. Marked muscular atrophy in the proximal calf. Marked muscular atrophy of the vastus lateralis, vastus intermedius, rectus femoris, biceps femoris, and semimembranosus musculature in the distal calf. Electronically Signed   By: Van Clines M.D.   On: 09/17/2017 12:13   Dg Knee Complete 4 Views Right  Result Date: 09/17/2017 CLINICAL DATA:  Pain and swelling.  Recent fall EXAM: RIGHT KNEE - COMPLETE 4+ VIEW COMPARISON:  November 08, 2014 FINDINGS: Frontal, lateral, and bilateral oblique views were obtained. There is screw and plate fixation throughout the distal femur. There  is an incomplete fracture along the medial aspect of the distal femoral diaphysis-metaphysis junction, seen only on the frontal view. There is no other fracture. No dislocation. There is a joint effusion with suggestion of fat-fluid level in the suprapatellar bursa. There is mild narrowing medially and laterally with spurring in these areas. There is mild chondrocalcinosis. IMPRESSION: 1.  Screw and plate fixation in the distal femur, stable. 2. Apparent incomplete fracture, medial aspect distal femoral diaphysis-metaphysis junction, seen only on the frontal view. There is a joint effusion with suggestion of suprapatellar bursal fat- fluid level, suggesting fracture. No other evident fracture. No dislocation. 3. Osteoarthritic change in medial and lateral compartments. Chondrocalcinosis present, a finding that may be seen with osteoarthritis or with calcium pyrophosphate deposition disease. Electronically Signed   By: Lowella Grip III M.D.   On: 09/17/2017 10:42     Positive  ROS: All other systems have been reviewed and were otherwise negative with the exception of those mentioned in the HPI and as above.  Physical Exam: BP 139/81   Pulse (!) 59   Temp 97.8 F (36.6 C) (Oral)   Resp 14   Ht 4' 11" (1.499 m)   Wt 61.2 kg (135 lb)   SpO2 100%   BMI 27.27 kg/m  General:  Alert, no acute distress Psychiatric:  Patient is competent for consent with normal mood and affect   Cardiovascular:  No pedal edema, regular rate and rhythm Respiratory:  No wheezing, non-labored breathing GI:  Abdomen is soft and non-tender Skin:  No lesions in the area of chief complaint, no erythema Neurologic:  Sensation intact distally, CN grossly intact Lymphatic:  No axillary or cervical lymphadenopathy  Orthopedic Exam:  RLE: 5/5 DF/PF/EHL SILT s/s/t/sp/dp distr Foot wwp TTP over distal femur over medial epicondyle ~2cm superficial abrasion over patella   X-rays/CT:  As above: minimally displaced vertical fracture through medial femoral condyle. Fracture line goes through screws from prior ORIF  Assessment/Plan: Jennifer [name] is a 84 y.o. female with a R minimally displaced vertical fracture through medial femoral condyle.   1. I discussed the various treatment options including both surgical and non-surgical management of her fracture with the patient and family present at the bedside. We agreed to proceed with non-operative management given the relatively anatomic alignment of the fracture fragment as well as the wish to avoid complications of surgery.  2. NWB on RLE 3. Knee immobilizer 4. PT/OT, may require admission for placement if unsafe for discharge. 5. F/U as an outpatient in 1-2 weeks where she will be transitioned to a hinged knee brace.     Leim Fabry   09/17/2017 1:53 PM

## 2017-09-17 NOTE — ED Notes (Signed)
First Nurse Note: Patient to X-Ray and then to Room 4.

## 2017-09-17 NOTE — ED Notes (Signed)
Per Dr. Mayford KnifeWilliams, pt is awaiting placement. Family and pt updated that she may be here overnight. Pt and family deny needs at this time. Will continue to monitor.

## 2017-09-18 DIAGNOSIS — S728X1A Other fracture of right femur, initial encounter for closed fracture: Secondary | ICD-10-CM | POA: Diagnosis not present

## 2017-09-18 MED ORDER — TRAMADOL HCL 50 MG PO TABS: 50.0000 mg | ORAL_TABLET | Freq: Four times a day (QID) | ORAL | 0 refills | Status: DC | PRN

## 2017-09-18 NOTE — Care Management Note (Signed)
Case Management Note  Patient Details  Name: Jennifer [name] MRN: [id number] Date of Birth: November 08, 1931  Subjective/Objective:    Patient to be discharged per MD order. RNCM and CSW have had multiple meetings regarding patient and family preference for placement into a facility. Unfortunately, the patient did not qualify for skilled nursing placement but is interested in home health services. I have spoke with the daughter, grand daughter and sister Jennifer [name] [phone] multiple times regarding the options for services. Per MD order RN, PT,SW and aide. Referral placed with [name] from [facility] who agrees to accept the patient. Transport will be provided via EMS. RNCM completed medical necessity form.  Sharia ReeveJosh Alois Mincer RN BSN RNCM [phone]             Action/Plan:   Expected Discharge Date:                  Expected Discharge Plan:     In-House Referral:     Discharge planning Services  CM Consult  Post Acute Care Choice:  Home Health Choice offered to:  Patient  DME Arranged:    DME Agency:     HH Arranged:  RN, PT, OT, Nurse's Aide HH Agency:  [facility]  Status of Service:  Completed, signed off  If discussed at Long Length of Stay Meetings, dates discussed:    Additional Comments:  Virgel ManifoldJosh A Kasir Hallenbeck, RN 09/18/2017, 3:21 PM

## 2017-09-18 NOTE — Progress Notes (Signed)
Called Care Management to meet patient she does not meet inpatient criteria-and does not have a 3 night qualifying stay therefore snf wont accept her. Patient has no means for private pay. Awaiting consult from Dr Cyril LoosenKinner. Called [facility] and was given a room and board fee 270 per day.   Called Care management to discuss in home supports and Sharia ReeveJosh will meet with patient shortly.   LCSW spoke to patients daughter in law who will stop by hospital to see what can be arranged. LCSW explained situation in full to both [place] and patient.   Delta Air LinesClaudine Bijon Mineer LCSW [phone]

## 2017-09-18 NOTE — ED Notes (Signed)
Breakfast tray ordered 

## 2017-09-18 NOTE — ED Notes (Signed)
SW in room to talk to patient.

## 2017-09-18 NOTE — ED Notes (Signed)
Per SW, pt pending placement at this time, not PT due to patient's inability to ambulate with either leg.

## 2017-09-18 NOTE — ED Notes (Signed)
Breakfast tray placed at bedside.  

## 2017-09-18 NOTE — Evaluation (Signed)
Physical Therapy Evaluation Patient Details Name: Jennifer [name] MRN: [id number] DOB: [birth year] Today's Date: 09/18/2017   History of Present Illness  Patient is a pleasant 84 y/o female that presents after mechanical fall on RLE sustaining non-displaced incomplete medial aspect distal femoral fracture. She has a history of fracture of more proximal portion of RLE 8 years ago. She has a history of polio and largely has non-functional LLE.   Clinical Impression  Patient presents with significant impairment in mobility from her baseline secondary to a fall and RLE distal femoral fracture. She has a history of polio and has largely been unable to weightbear through her LLE, requiring her to use her RLE for ambulation. Prior to this incident she was able to perform household and community ambulation without assistive device, and was doing her own household chores/cooking. She is unable to attempt standing transfers this session, and required assistance to hold her RLE for scooting up and down in bed to mimic use of slideboard for transfer. Given her stark change in mobility status she would benefit from skilled PT services in the SNF setting to improve her functional mobility after discharge.     Follow Up Recommendations SNF    Equipment Recommendations       Recommendations for Other Services       Precautions / Restrictions Precautions Precautions: Fall Required Braces or Orthoses: Knee Immobilizer - Right Knee Immobilizer - Right: On at all times Restrictions Weight Bearing Restrictions: Yes RLE Weight Bearing: Non weight bearing      Mobility  Bed Mobility Overal bed mobility: Needs Assistance Bed Mobility: Supine to Sit;Sit to Supine     Supine to sit: Min assist;Mod assist Sit to supine: Min assist;Mod assist   General bed mobility comments: Patient required assistance to negotiate transfers with RLE support, is able to complete trunkal mobilit with use of UEs and core  control.   Transfers                    Ambulation/Gait                Stairs            Wheelchair Mobility    Modified Rankin (Stroke Patients Only)       Balance Overall balance assessment: Needs assistance;History of Falls Sitting-balance support: Bilateral upper extremity supported Sitting balance-Leahy Scale: Fair Sitting balance - Comments: Patient is able to scoot up and down in bed with UEs for support both L and R with support underneath her RLE.                                      Pertinent Vitals/Pain Pain Assessment: Faces Faces Pain Scale: Hurts a little bit Pain Location: RLE  Pain Descriptors / Indicators: Aching Pain Intervention(s): Limited activity within patient's tolerance;Repositioned    Home Living Family/patient expects to be discharged to:: Private residence Living Arrangements: Alone Available Help at Discharge: Family Type of Home: House Home Access: Other (comment)(Small lip to enter house.)     Home Layout: One level Home Equipment: Walker - 2 wheels;Wheelchair - manual;Bedside commode Additional Comments: Patient reports she has a slideboard    Prior Function Level of Independence: Independent         Comments: Patient has someone drive her to grocery store, otherwise she does her chores, cooks, and goes around [employer] grocery store independently.  Hand Dominance        Extremity/Trunk Assessment   Upper Extremity Assessment Upper Extremity Assessment: Overall WFL for tasks assessed    Lower Extremity Assessment Lower Extremity Assessment: RLE deficits/detail;LLE deficits/detail RLE Deficits / Details: Able to perform 5 SLRs with mod-max A and min A for hip abduction.  RLE: Unable to fully assess due to immobilization LLE Deficits / Details: No PF/DF at the LLE, able to perform 10 SLRs, hip abductions, knee flexion        Communication   Communication: No difficulties  Cognition  Arousal/Alertness: Awake/alert Behavior During Therapy: WFL for tasks assessed/performed Overall Cognitive Status: Within Functional Limits for tasks assessed                                        General Comments      Exercises     Assessment/Plan    PT Assessment Patient needs continued PT services  PT Problem List Decreased strength;Decreased range of motion;Pain;Decreased activity tolerance;Decreased knowledge of use of DME;Decreased balance;Decreased safety awareness;Decreased mobility       PT Treatment Interventions DME instruction;Therapeutic activities;Gait training;Therapeutic exercise;Patient/family education;Balance training;Wheelchair mobility training;Functional mobility training;Neuromuscular re-education    PT Goals (Current goals can be found in the Care Plan section)  Acute Rehab PT Goals Patient Stated Goal: To return to her PLOF. PT Goal Formulation: With patient/family Time For Goal Achievement: 10/02/17 Potential to Achieve Goals: Good    Frequency 7X/week   Barriers to discharge Inaccessible home environment;Decreased caregiver support Patient will be immobile and require round the clock care.     Co-evaluation               AM-PAC PT "6 Clicks" Daily Activity  Outcome Measure Difficulty turning over in bed (including adjusting bedclothes, sheets and blankets)?: A Little Difficulty moving from lying on back to sitting on the side of the bed? : A Little Difficulty sitting down on and standing up from a chair with arms (e.g., wheelchair, bedside commode, etc,.)?: Unable Help needed moving to and from a bed to chair (including a wheelchair)?: Total Help needed walking in hospital room?: Total Help needed climbing 3-5 steps with a railing? : Total 6 Click Score: 10    End of Session Equipment Utilized During Treatment: Right knee immobilizer Activity Tolerance: Patient tolerated treatment well Patient left: in bed;with  family/visitor present Nurse Communication: Mobility status PT Visit Diagnosis: History of falling (Z91.81);Difficulty in walking, not elsewhere classified (R26.2)    Time: 1610-9604 PT Time Calculation (min) (ACUTE ONLY): 16 min   Charges:   PT Evaluation $PT Eval Moderate Complexity: 1 Mod     PT G Codes:        Alva Garnet PT, DPT, CSCS    09/18/2017, 12:20 PM

## 2017-09-18 NOTE — ED Notes (Signed)
Report to Shanda BumpsJessica RN, Per Dr. Cyril LoosenKinner, cancel order for in and out cath and UA.

## 2017-09-18 NOTE — ED Notes (Signed)
Pt resting quietly in room, eyes closed respirations equal and unlabored.  Pending PT evaluation.

## 2017-09-18 NOTE — Progress Notes (Signed)
LCSW spoke with care manager and Jennifer [name] will dc home once Dr Cyril LoosenKinner has final assessment. This patient is agreeable to in home supports and care manager Josh will set those up. No further SW needs  Delta Air LinesClaudine Tabbatha Bordelon LCSW [phone]

## 2017-09-18 NOTE — ED Notes (Signed)
Family in room talking with case manager.

## 2017-09-18 NOTE — ED Notes (Signed)
Purewick external catheter applied for pt's comfort.

## 2017-09-18 NOTE — ED Provider Notes (Signed)
Patient seen by social work and Sports coachcase manager.  Patient qualifies for significant home health care and family is available to help at home as well.    [name], [name], MD 09/18/17 1009

## 2017-09-18 NOTE — Progress Notes (Signed)
LCSW called [facility], and left message for admissions, Called [facility], left message all information was sent via the hub.  Awaiting call backs  Ely Spragg [place] LCSW  [phone]

## 2017-09-18 NOTE — ED Notes (Signed)
Lunch tray placed at bedside. Pt states she isn't hungry at this time.

## 2017-09-18 NOTE — Progress Notes (Signed)
LCSW met with family and they have decided to go back to home care vs SNF They came to understand that it could be arranged for room and board in SNF short term but that would be 270.00 per day with a 30 day minimum. LCSW provided family with both a SNF and ALF list for the family and additional eldercare resource list for in home personal support worker.  Patients daughter in law was very upset of the 3 night qualifying stay and the entire process. LCSW apologized to her. It was disclosed she recently lost her husband 3 months ago. Patients granddaughter remained in meeting and this worker provided support to patient and granddaughter sand this worker made every attempt to find in home additional supports and reviewed that [facility] does have a bed, [facility] and [facility] has some availability as well.  LCSW expressed to family ... if the patient is not feeling well or increased pain to follow up with medical intervention ASAP or come back to hospital LCSW provided work cell phone number to granddaughter if she needed further assistance to call.   LCSW notified ED secretary to order EMS transport home  [place] LCSW [phone]

## 2017-12-11 IMAGING — CR DG CHEST 2V
2 series · 2 of 2 positions shown · non-contrast
Comparison: 04/02/2015.  Chest CTA dated 04/02/2015.

CLINICAL DATA: Weakness. Fluttering sensation in the chest this
morning.

EXAM:
CHEST  2 VIEW

[chest pa]
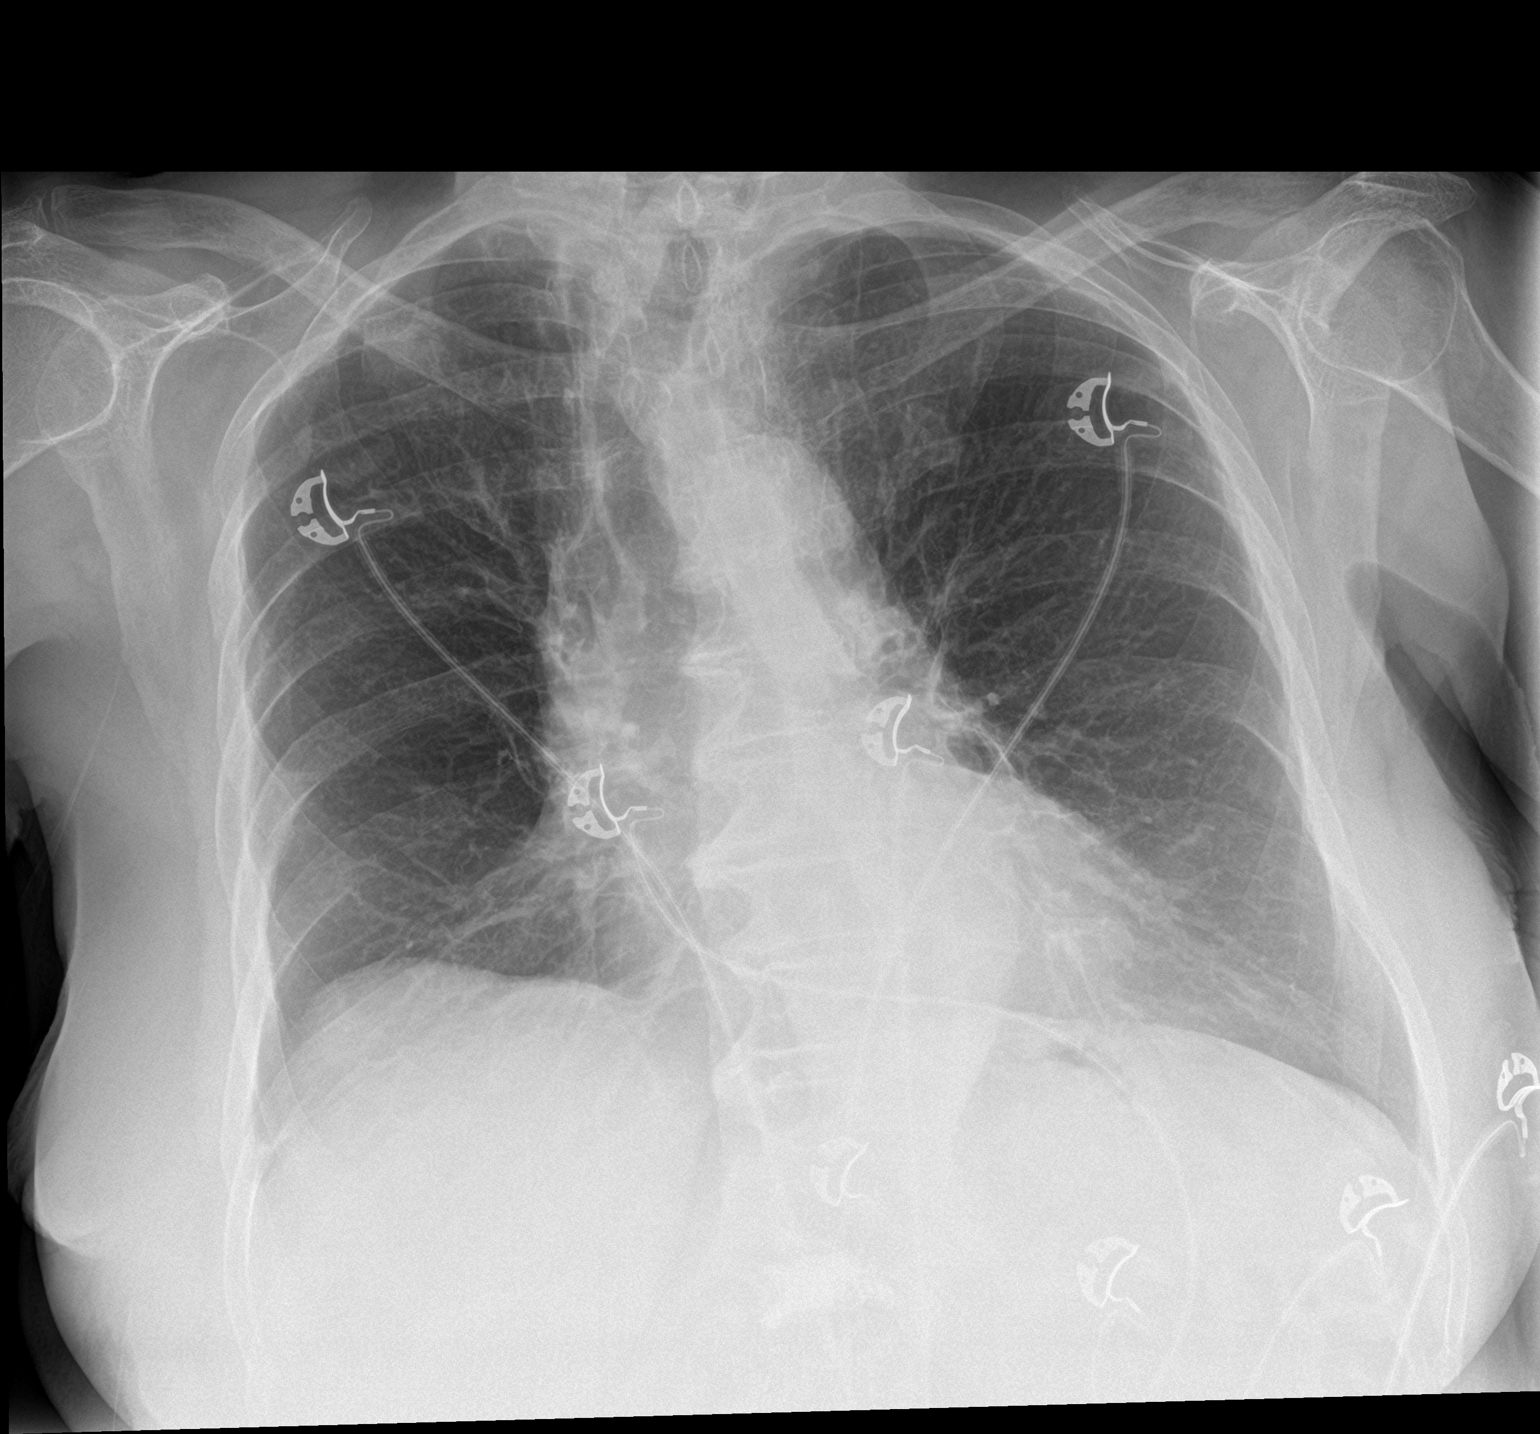

[chest lat]
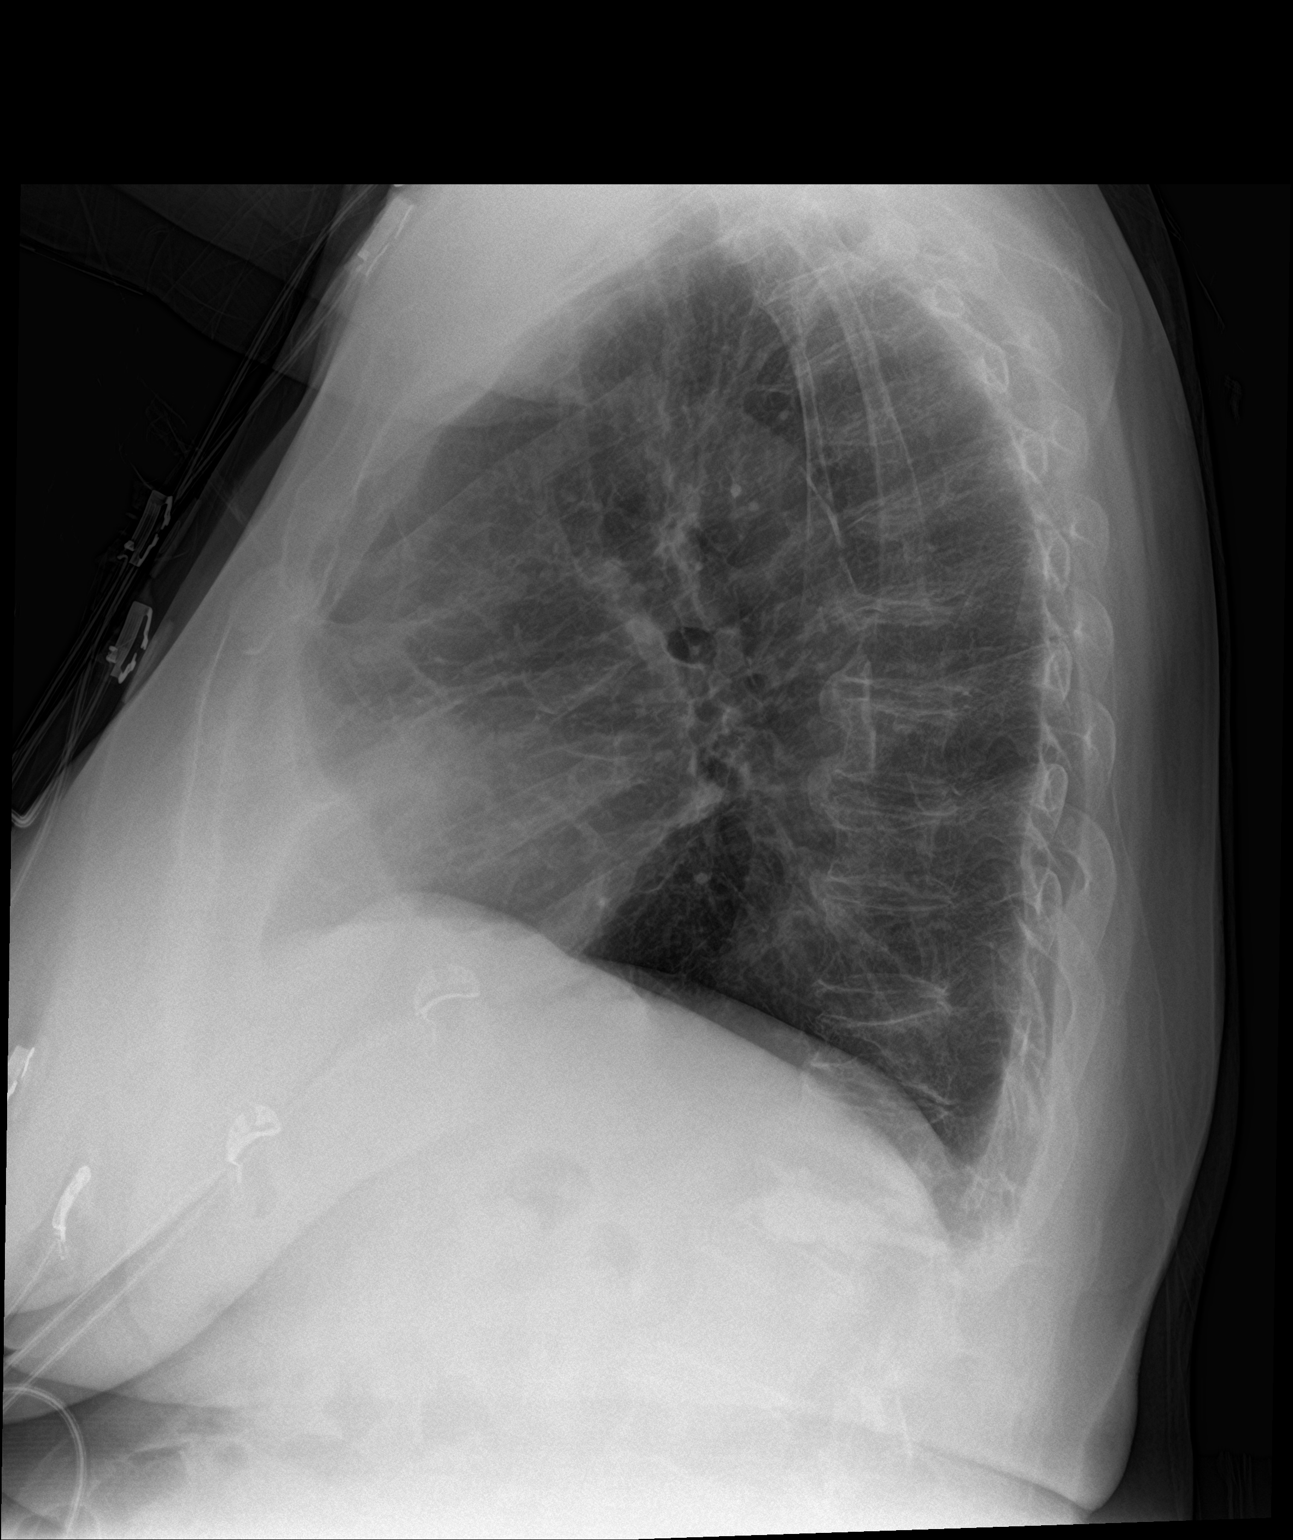

[2 of 2 positions shown; findings below may reference images not displayed]

FINDINGS: Normal sized heart. Tortuous aorta. Small amount of residual linear
scarring in the left lower lobe. Otherwise, clear lungs. Thoracic
spine degenerative changes. Diffuse osteopenia. Stable thoracolumbar
vertebral compression deformities. One again contains kyphoplasty
material.
IMPRESSION: No acute abnormality.

## 2018-04-11 ENCOUNTER — Emergency Department
Admission: EM | Admit: 2018-04-11 | Discharge: 2018-04-11 | Disposition: A | Discharge status: Home / Self Care | Payer: Medicare Other | Attending: Emergency Medicine | Admitting: Emergency Medicine

## 2018-04-11 ENCOUNTER — Emergency Department: Payer: Medicare Other

## 2018-04-11 ENCOUNTER — Encounter: Payer: Self-pay | Admitting: Emergency Medicine

## 2018-04-11 ENCOUNTER — Other Ambulatory Visit: Payer: Self-pay

## 2018-04-11 DIAGNOSIS — I10 Essential (primary) hypertension: Secondary | ICD-10-CM | POA: Insufficient documentation

## 2018-04-11 DIAGNOSIS — Y999 Unspecified external cause status: Secondary | ICD-10-CM | POA: Diagnosis not present

## 2018-04-11 DIAGNOSIS — Y929 Unspecified place or not applicable: Secondary | ICD-10-CM | POA: Diagnosis not present

## 2018-04-11 DIAGNOSIS — M549 Dorsalgia, unspecified: Secondary | ICD-10-CM | POA: Diagnosis present

## 2018-04-11 DIAGNOSIS — Y939 Activity, unspecified: Secondary | ICD-10-CM | POA: Diagnosis not present

## 2018-04-11 DIAGNOSIS — T148XXA Other injury of unspecified body region, initial encounter: Secondary | ICD-10-CM | POA: Insufficient documentation

## 2018-04-11 DIAGNOSIS — Z7901 Long term (current) use of anticoagulants: Secondary | ICD-10-CM | POA: Diagnosis not present

## 2018-04-11 DIAGNOSIS — R109 Unspecified abdominal pain: Secondary | ICD-10-CM | POA: Diagnosis not present

## 2018-04-11 DIAGNOSIS — Z79899 Other long term (current) drug therapy: Secondary | ICD-10-CM | POA: Diagnosis not present

## 2018-04-11 DIAGNOSIS — X58XXXA Exposure to other specified factors, initial encounter: Secondary | ICD-10-CM | POA: Insufficient documentation

## 2018-04-11 LAB — BRAIN NATRIURETIC PEPTIDE: B Natriuretic Peptide: 52 pg/mL (ref 0.0–100.0)

## 2018-04-11 LAB — CBC WITH DIFFERENTIAL/PLATELET
Abs Immature Granulocytes: 0.01 10*3/uL (ref 0.00–0.07)
Basophils Absolute: 0 10*3/uL (ref 0.0–0.1)
Basophils Relative: 0 %
Eosinophils Absolute: 0 10*3/uL (ref 0.0–0.5)
Eosinophils Relative: 0 %
HCT: 40.5 % (ref 36.0–46.0)
Hemoglobin: 13.1 g/dL (ref 12.0–15.0)
Immature Granulocytes: 0 %
Lymphocytes Relative: 24 %
Lymphs Abs: 1.6 10*3/uL (ref 0.7–4.0)
MCH: 30.2 pg (ref 26.0–34.0)
MCHC: 32.3 g/dL (ref 30.0–36.0)
MCV: 93.3 fL (ref 80.0–100.0)
Monocytes Absolute: 0.6 10*3/uL (ref 0.1–1.0)
Monocytes Relative: 8 %
Neutro Abs: 4.7 10*3/uL (ref 1.7–7.7)
Neutrophils Relative %: 68 %
Platelets: 280 10*3/uL (ref 150–400)
RBC: 4.34 MIL/uL (ref 3.87–5.11)
RDW: 13.6 % (ref 11.5–15.5)
WBC: 7 10*3/uL (ref 4.0–10.5)
nRBC: 0 % (ref 0.0–0.2)

## 2018-04-11 LAB — BASIC METABOLIC PANEL
Anion gap: 8 (ref 5–15)
BUN: 19 mg/dL (ref 8–23)
CO2: 24 mmol/L (ref 22–32)
Calcium: 9.6 mg/dL (ref 8.9–10.3)
Chloride: 107 mmol/L (ref 98–111)
Creatinine, Ser: 0.65 mg/dL (ref 0.44–1.00)
GFR calc Af Amer: >60 mL/min (ref >60)
GFR calc non Af Amer: >60 mL/min (ref >60)
Glucose, Bld: 141 mg/dL — ABNORMAL HIGH (ref 70–99)
Potassium: 3.8 mmol/L (ref 3.5–5.1)
Sodium: 139 mmol/L (ref 135–145)

## 2018-04-11 LAB — TROPONIN I: Troponin I: <0.03 ng/mL (ref <0.03)

## 2018-04-11 MED ORDER — IOHEXOL 350 MG/ML SOLN
100.0000 mL | Freq: Once | INTRAVENOUS | Status: AC | PRN
  Administered 2018-04-11: 100 mL via INTRAVENOUS
  Filled 2018-04-11: qty 100

## 2018-04-11 NOTE — ED Provider Notes (Signed)
[facility] Emergency Department Provider Note  ____________________________________________  Time seen: Approximately 6:47 PM  I have reviewed the triage vital signs and the nursing notes.   HISTORY  Chief Complaint Back Pain    HPI Jennifer [name] is a 85 y.o. female with a history of hypertension, atrial fibrillation and hyperlipidemia, presents to the emergency department with left-sided upper back pain for the past 5 days.  Patient reports that she had a fall in July and sustained a right femur fracture but has had no recent falls or traumas.  She denies breathlessness or chest pain but states that "you know heart attacks show up differently in women".  She denies current chest pain and chest tightness but states that she has had abdominal discomfort that she had associated with constipation.  Patient is currently taking Xarelto for atrial fibrillation.  She denies recent surgery, prolonged immobility, recent travel or daily smoking.  Patient reports that pain is sometimes worsened by deep inspiration but not every time she takes a deep breath.  Patient has been taking Tylenol but reports that it only relieves her pain some.   Past Medical History:  Diagnosis Date  . Arthritis   . Atrial fibrillation (HCC)   . High cholesterol   . Hypertension   . Polio     Patient Active Problem List   Diagnosis Date Noted  . A-fib (HCC) 03/01/2016    Past Surgical History:  Procedure Laterality Date  . BACK SURGERY    . FEMUR FRACTURE SURGERY  2010  . HIP SURGERY Left     Prior to Admission medications   Medication Sig Start Date End Date Taking? Authorizing Provider  acetaminophen (TYLENOL) 500 MG tablet Take 500 mg by mouth every 6 (six) hours as needed for pain.     [provider]  Cyanocobalamin (B-12 COMPLIANCE INJECTION) 1000 MCG/ML KIT Inject 1 Dose as directed daily.    [provider]  diltiazem (CARDIZEM CD) 240 MG 24 hr  capsule Take 240 mg by mouth daily. 09/15/17   [provider]  ibuprofen (ADVIL,MOTRIN) 600 MG tablet Take 1 tablet (600 mg total) by mouth every 8 (eight) hours as needed for moderate pain (with food). Patient not taking: Reported on 03/14/2016 03/01/15   Norman, Anne-Caroline, MD  lisinopril (PRINIVIL,ZESTRIL) 40 MG tablet Take 1 tablet by mouth daily. 02/25/15   [provider]  metoprolol tartrate (LOPRESSOR) 25 MG tablet Take 1 tablet (25 mg total) by mouth 2 (two) times daily. Patient not taking: Reported on 03/14/2016 03/02/16   Patel, Shreyang, MD  omeprazole (PRILOSEC) 20 MG capsule Take 1 capsule by mouth 2 (two) times daily. 03/08/15   [provider]  rivaroxaban (XARELTO) 20 MG TABS tablet Take 1 tablet (20 mg total) by mouth daily with supper. 03/02/16   Patel, Shreyang, MD  simvastatin (ZOCOR) 10 MG tablet Take 10 mg by mouth at bedtime. 09/11/17   [provider]  traMADol (ULTRAM) 50 MG tablet Take 1 tablet (50 mg total) by mouth every 6 (six) hours as needed for severe pain. 09/18/17   Kinner, Robert, MD  Vitamin D, Ergocalciferol, (DRISDOL) 50000 units CAPS capsule Take 1 capsule by mouth every 30 (thirty) days. 03/08/15   [provider]    Allergies Patient has no known allergies.  Family History  Problem Relation Age of Onset  . Stroke Mother   . Heart attack Father   . Diabetes Sister   . Heart attack Sister   .   Cancer Brother     Social History Social History   Tobacco Use  . Smoking status: Never Smoker  . Smokeless tobacco: Never Used  Substance Use Topics  . Alcohol use: No  . Drug use: No     Review of Systems  Constitutional: No fever/chills Eyes: No visual changes. No discharge ENT: No upper respiratory complaints. Cardiovascular: Patient has left sided upper back pain.  Respiratory: no cough. No SOB. Gastrointestinal: Patient has abdominal discomfort.  No nausea, no vomiting.  No diarrhea.  No  constipation. Genitourinary: Negative for dysuria. No hematuria Musculoskeletal: Negative for musculoskeletal pain. Skin: Negative for rash, abrasions, lacerations, ecchymosis. Neurological: Negative for headaches, focal weakness or numbness.   ____________________________________________   PHYSICAL EXAM:  VITAL SIGNS: ED Triage Vitals  Enc Vitals Group     BP 04/11/18 1830 (!) 151/86     Pulse Rate 04/11/18 1830 89     Resp 04/11/18 1830 20     Temp 04/11/18 1830 98.1 F (36.7 C)     Temp Source 04/11/18 1830 Oral     SpO2 04/11/18 1830 99 %     Weight 04/11/18 1710 134 lb 14.7 oz (61.2 kg)     Height --      Head Circumference --      Peak Flow --      Pain Score 04/11/18 1710 7     Pain Loc --      Pain Edu? --      Excl. in GC? --      Constitutional: Alert and oriented.  Patient is alert and talkative. Eyes: Conjunctivae are normal. PERRL. EOMI. Head: Atraumatic. ENT:      Ears: TMs are pearly.      Nose: No congestion/rhinnorhea.      Mouth/Throat: Mucous membranes are moist.  Neck: No stridor.  No cervical spine tenderness to palpation.  Cardiovascular: Normal rate, regular rhythm. Normal S1 and S2.  Good peripheral circulation. Respiratory: Normal respiratory effort without tachypnea or retractions. Lungs CTAB. Good air entry to the bases with no decreased or absent breath sounds. Gastrointestinal: Bowel sounds 4 quadrants. Soft and nontender to palpation. No guarding or rigidity. No palpable masses. No distention. No CVA tenderness. Musculoskeletal: Full range of motion to all extremities. No gross deformities appreciated.  Upper back pain is not reproduced by palpation. Neurologic:  Normal speech and language. No gross focal neurologic deficits are appreciated.  Skin:  Skin is warm, dry and intact. No rash noted. Psychiatric: Mood and affect are normal. Speech and behavior are normal. Patient exhibits appropriate insight and  judgement.   ____________________________________________   LABS (all labs ordered are listed, but only abnormal results are displayed)  Labs Reviewed  BASIC METABOLIC PANEL - Abnormal; Notable for the following components:      Result Value   Glucose, Bld 141 (*)    All other components within normal limits  CBC WITH DIFFERENTIAL/PLATELET  BRAIN NATRIURETIC PEPTIDE  TROPONIN I   ____________________________________________  EKG   ____________________________________________  RADIOLOGY I personally viewed and evaluated these images as part of my medical decision making, as well as reviewing the written report by the radiologist.  Dg Chest 2 View  Result Date: 04/11/2018 CLINICAL DATA:  86-year-old female with a history of scapular pain for 5 days EXAM: CHEST - 2 VIEW COMPARISON:  03/14/2016 FINDINGS: Cardiomediastinal silhouette unchanged in size and contour. Bronchiectasis again noted. Architectural distortion of the bilateral hilar regions and suprahilar regions. No pneumothorax. No pleural effusion.   No confluent airspace disease. Redemonstration of compression fractures of the lower thoracic spine with similar appearance of changes of vertebral augmentation. No new acute displaced fracture.  Osteopenia. IMPRESSION: Chronic lung changes without evidence of acute cardiopulmonary disease. Electronically Signed   By: Jaime  Wagner D.O.   On: 04/11/2018 17:52   Dg Thoracic Spine 2 View  Result Date: 04/11/2018 CLINICAL DATA:  86-year-old female with a history of left back pain for 5 days EXAM: THORACIC SPINE 2 VIEWS COMPARISON:  03/14/2016, 03/01/2016 FINDINGS: Thoracic Spine: Thoracic vertebral elements maintain previous alignment. No subluxation. Scoliotic curvature again noted. Osteopenia. No acute fracture line identified. Redemonstration of fractures of T11 and T12 with changes of vertebroplasty at L1. No further height loss as compared to the prior chest x-ray of 03/14/2016. No  new fracture identified. Chronic posttraumatic deformity of the sternum. Multilevel degenerative changes throughout the thoracic spine. Unremarkable appearance of the visualized thorax. IMPRESSION: No acute fracture or malalignment of the thoracic spine. Redemonstration of T11 and T12 compression fracture as well as changes of vertebral augmentation of L1. Configuration similar to that of the plain film dated 03/14/2016. Osteopenia and degenerative changes. Electronically Signed   By: Jaime  Wagner D.O.   On: 04/11/2018 19:18   Ct Angio Chest/abd/pel For Dissection W And/or Wo Contrast  Result Date: 04/11/2018 CLINICAL DATA:  Left mid and scapula back pain x5 days. Pain worse with movement. EXAM: CT ANGIOGRAPHY CHEST, ABDOMEN AND PELVIS TECHNIQUE: Multidetector CT imaging through the chest, abdomen and pelvis was performed using the standard protocol during bolus administration of intravenous contrast. Multiplanar reconstructed images and MIPs were obtained and reviewed to evaluate the vascular anatomy. CONTRAST:  100mL OMNIPAQUE IOHEXOL 350 MG/ML SOLN COMPARISON:  Same day CXR, chest CT 04/02/2015 FINDINGS: CTA CHEST FINDINGS Cardiovascular: Conventional branch pattern of the great vessels with atherosclerosis at the origin of the left subclavian. Mild atherosclerosis of the thoracic aorta without aneurysm. On the unenhanced study, there is no mural hematoma of the aorta. After IV contrast, no aortic dissection is noted. No large central pulmonary embolus. Normal size heart without pericardial effusion or thickening. Slight coronary arteriosclerosis along the RCA. Mediastinum/Nodes: No enlarged mediastinal, hilar, or axillary lymph nodes. Thyroid gland, trachea, and esophagus demonstrate no significant findings. Lungs/Pleura: Scarring at the apices with centrilobular emphysema is identified. No pulmonary consolidation or dominant mass. No effusion or pneumothorax. Tiny subpleural density in the right lower lobe  likely representing minimal postinfectious or inflammatory change. Musculoskeletal: Healed fracture deformity of the upper sternum. Osteopenic appearance of the thoracolumbar spine moderate T11 and marked T12 compression deformities, chronic in appearance. Review of the MIP images confirms the above findings. CTA ABDOMEN AND PELVIS FINDINGS VASCULAR Aorta: Normal caliber aorta without aneurysm, dissection, vasculitis or significant stenosis. Celiac: Patent without evidence of aneurysm, dissection, vasculitis or significant stenosis. Mild calcific atherosclerosis at the origin. SMA: Patent without evidence of aneurysm, dissection, vasculitis or significant stenosis. Mild calcific atherosclerosis at the origin without significant stenosis. Renals: Both renal arteries are patent without evidence of aneurysm, dissection, vasculitis, fibromuscular dysplasia or significant stenosis. IMA: Patent without evidence of aneurysm, dissection, vasculitis or significant stenosis. Inflow: Patent without evidence of aneurysm, dissection, vasculitis or significant stenosis. Veins: No obvious venous abnormality within the limitations of this arterial phase study. Review of the MIP images confirms the above findings. NON-VASCULAR Hepatobiliary: Steatosis of the liver. Nonspecific hypodensity in the right hepatic lobe too small to further characterize statistically consistent with a cyst or hemangioma measuring 7 mm. No   biliary dilatation. Uncomplicated cholelithiasis is identified. Pancreas: Unremarkable. No pancreatic ductal dilatation or surrounding inflammatory changes. Spleen: Normal in size without focal abnormality. Adrenals/Urinary Tract: Adrenal glands are unremarkable. Parapelvic renal cysts are identified bilaterally with mild diffuse cortical scarring of both kidneys. No enhancing mass lesions. Small cortical cyst in the upper pole the left kidney measuring 6 mm a 16 mm cyst arising off the upper pole the right kidney. No  hydroureteronephrosis. The urinary bladder is unremarkable. Stomach/Bowel: Small hiatal hernia. Decompressed stomach with normal small bowel rotation, distal and terminal ileum and large bowel. No acute bowel inflammation. Sigmoid diverticulosis is noted without acute diverticulitis. The appendix is not confidently identified no pericecal inflammation to suggest acute appendicitis. Lymphatic: No lymphadenopathy. Reproductive: Uterus and bilateral adnexa are unremarkable. Other: No free air or free fluid. Musculoskeletal: Osteoporotic compression fractures with kyphoplasty cement noted at L1 and L4 with lesser degrees of osteoporotic compressions involving L2, L3 and L5. Multilevel degenerative facet arthropathy is seen. Review of the MIP images confirms the above findings. IMPRESSION: Chest CT: 1. No aortic aneurysm or dissection. 2. No acute pulmonary embolus. 3. Scarring at the apices with centrilobular emphysema. No active pulmonary disease. CT AP: 1. Steatosis of the liver. 2. Uncomplicated cholelithiasis. 3. 7 mm hypodensity in the right hepatic lobe, too small to further characterize but statistically consistent with a cyst or hemangioma. 4. Cortical bilateral parapelvic renal cysts. 5. Sigmoid diverticulosis without acute diverticulitis. 6. 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 Electronically Signed   By: Ashley Royalty M.D.   On: 04/11/2018 20:58    ____________________________________________    PROCEDURES  Procedure(s) performed:    Procedures    Medications  iohexol (OMNIPAQUE) 350 MG/ML injection 100 mL (100 mLs Intravenous Contrast Given 04/11/18 2027)     ____________________________________________   INITIAL IMPRESSION / ASSESSMENT AND PLAN / ED COURSE  Pertinent labs & imaging results that were available during my care of the patient were reviewed by me and considered in my medical decision making (see  chart for details).  Review of the [place] CSRS was performed in accordance of the [place] prior to dispensing any controlled drugs.      Assessment and Plan:  Muscle Strain:  Patient presents to the emergency department with nonreproducible left-sided upper back pain for the past 4 to 5 days.  Differential diagnosis included thoracic aneurysm, community-acquired pneumonia, STEMI and pneumothorax.  CT chest, abdomen and pelvis revealed no acute abnormality.  Chest x-ray reveals no consolidations, opacities or infiltrates.  No evidence of pleural effusion or pneumothorax.  EKG revealed normal sinus rhythm without ST segment elevation or apparent arrhythmia.  Troponin was within reference range.  BMP was also within reference range.  Patient reports that her pain has been well managed at home with Tylenol.  I discussed the risk of falling with muscle relaxers at home and patient declined additional medication for pain.  Patient was advised to follow-up with orthopedics as needed.  All patient questions were answered.  ____________________________________________  FINAL CLINICAL IMPRESSION(S) / ED DIAGNOSES  Final diagnoses:  Muscle strain      NEW MEDICATIONS STARTED DURING THIS VISIT:  ED Discharge Orders    None          This chart was dictated using voice recognition software/Dragon. Despite best efforts to proofread, errors can occur which can change the meaning. Any change was purely unintentional.    Lannie Fields, PA-C 04/11/18 2133  Norman, Anne-Caroline, MD 04/11/18 2341  

## 2018-04-11 NOTE — ED Triage Notes (Signed)
C/O left mid / scapular back pain x 5 days.  States pain worse with movement, C and DB.  States has had a collapsed lung in the past and pain felt similar.  AAOx3.  Skin warm and dry. NAD

## 2018-04-11 NOTE — ED Provider Notes (Signed)
I did not see this patient.    ED ECG REPORT I, Anne-Caroline Sharma Covert, the attending physician, personally viewed and interpreted this ECG.   Date: 04/11/2018  EKG Time: 1958  Rate: 75  Rhythm: normal sinus rhythm  Axis: normal  Intervals:none  ST&T Change: No STEMI    Rockne Menghini, MD 04/11/18 2002

## 2018-04-11 NOTE — ED Notes (Signed)
See triage note  Presents with pain to left upper back for the past 5 days   Pain is mainly at scapula   No trauma  Or fever  Pain increases with cough

## 2018-05-26 ENCOUNTER — Encounter: Payer: Self-pay | Admitting: *Deleted

## 2018-05-26 ENCOUNTER — Other Ambulatory Visit: Payer: Self-pay

## 2018-05-26 NOTE — Discharge Instructions (Signed)

## 2018-05-30 ENCOUNTER — Ambulatory Visit: Payer: Medicare Other | Admitting: Anesthesiology

## 2018-05-30 ENCOUNTER — Ambulatory Visit
Admission: RE | Admit: 2018-05-30 | Discharge: 2018-05-30 | Disposition: A | Discharge status: Home / Self Care | Payer: Medicare Other | Attending: Ophthalmology | Admitting: Ophthalmology

## 2018-05-30 ENCOUNTER — Encounter
Admission: RE | Disposition: A | Discharge status: Home / Self Care | Payer: Self-pay | Source: Home / Self Care | Attending: Ophthalmology

## 2018-05-30 DIAGNOSIS — J449 Chronic obstructive pulmonary disease, unspecified: Secondary | ICD-10-CM | POA: Insufficient documentation

## 2018-05-30 DIAGNOSIS — I4891 Unspecified atrial fibrillation: Secondary | ICD-10-CM | POA: Diagnosis not present

## 2018-05-30 DIAGNOSIS — Z79899 Other long term (current) drug therapy: Secondary | ICD-10-CM | POA: Diagnosis not present

## 2018-05-30 DIAGNOSIS — K219 Gastro-esophageal reflux disease without esophagitis: Secondary | ICD-10-CM | POA: Diagnosis not present

## 2018-05-30 DIAGNOSIS — E7800 Pure hypercholesterolemia, unspecified: Secondary | ICD-10-CM | POA: Diagnosis not present

## 2018-05-30 DIAGNOSIS — H2512 Age-related nuclear cataract, left eye: Secondary | ICD-10-CM | POA: Insufficient documentation

## 2018-05-30 DIAGNOSIS — I10 Essential (primary) hypertension: Secondary | ICD-10-CM | POA: Diagnosis not present

## 2018-05-30 DIAGNOSIS — Z8612 Personal history of poliomyelitis: Secondary | ICD-10-CM | POA: Insufficient documentation

## 2018-05-30 HISTORY — DX: Gastro-esophageal reflux disease without esophagitis: K21.9

## 2018-05-30 HISTORY — PX: CATARACT EXTRACTION W/PHACO: SHX586

## 2018-05-30 SURGERY — PHACOEMULSIFICATION, CATARACT, WITH IOL INSERTION
Anesthesia: Monitor Anesthesia Care | Site: Eye | Laterality: Left

## 2018-05-30 MED ORDER — ARMC OPHTHALMIC DILATING DROPS
1.0000 "application " | OPHTHALMIC | Status: DC | PRN
  Administered 2018-05-30 (×2): 1 via OPHTHALMIC

## 2018-05-30 MED ORDER — FENTANYL CITRATE (PF) 100 MCG/2ML IJ SOLN
INTRAMUSCULAR | Status: DC | PRN
  Administered 2018-05-30: 50 ug via INTRAVENOUS

## 2018-05-30 MED ORDER — OXYCODONE HCL 5 MG/5ML PO SOLN: 5.0000 mg | Freq: Once | ORAL | Status: DC | PRN

## 2018-05-30 MED ORDER — TETRACAINE HCL 0.5 % OP SOLN
1.0000 [drp] | OPHTHALMIC | Status: DC | PRN
  Administered 2018-05-30 (×2): 1 [drp] via OPHTHALMIC

## 2018-05-30 MED ORDER — SODIUM HYALURONATE 23 MG/ML IO SOLN
INTRAOCULAR | Status: DC | PRN
  Administered 2018-05-30: 0.6 mL via INTRAOCULAR

## 2018-05-30 MED ORDER — MIDAZOLAM HCL 2 MG/2ML IJ SOLN
INTRAMUSCULAR | Status: DC | PRN
  Administered 2018-05-30: 1 mg via INTRAVENOUS

## 2018-05-30 MED ORDER — EPINEPHRINE PF 1 MG/ML IJ SOLN
INTRAOCULAR | Status: DC | PRN
  Administered 2018-05-30: 72 mL via OPHTHALMIC

## 2018-05-30 MED ORDER — LIDOCAINE HCL (PF) 2 % IJ SOLN
INTRAOCULAR | Status: DC | PRN
  Administered 2018-05-30: 1 mL via INTRAOCULAR

## 2018-05-30 MED ORDER — OXYCODONE HCL 5 MG PO TABS: 5.0000 mg | ORAL_TABLET | Freq: Once | ORAL | Status: DC | PRN

## 2018-05-30 MED ORDER — LACTATED RINGERS IV SOLN: INTRAVENOUS | Status: DC

## 2018-05-30 MED ORDER — SODIUM HYALURONATE 10 MG/ML IO SOLN
INTRAOCULAR | Status: DC | PRN
  Administered 2018-05-30: 0.55 mL via INTRAOCULAR

## 2018-05-30 MED ORDER — MOXIFLOXACIN HCL 0.5 % OP SOLN
OPHTHALMIC | Status: DC | PRN
  Administered 2018-05-30: 0.2 mL via OPHTHALMIC

## 2018-05-30 SURGICAL SUPPLY — 19 items
CANNULA ANT/CHMB 27G (MISCELLANEOUS) ×2 IMPLANT
CANNULA ANT/CHMB 27GA (MISCELLANEOUS) ×6 IMPLANT
DISSECTOR HYDRO NUCLEUS 50X22 (MISCELLANEOUS) ×3 IMPLANT
GLOVE SURG LX 7.5 STRW (GLOVE) ×2
GLOVE SURG LX STRL 7.5 STRW (GLOVE) ×1 IMPLANT
GLOVE SURG SYN 8.5  E (GLOVE) ×2
GLOVE SURG SYN 8.5 E (GLOVE) ×1 IMPLANT
GLOVE SURG SYN 8.5 PF PI (GLOVE) ×1 IMPLANT
GOWN STRL REUS W/ TWL LRG LVL3 (GOWN DISPOSABLE) ×2 IMPLANT
GOWN STRL REUS W/TWL LRG LVL3 (GOWN DISPOSABLE) ×4
LENS IOL ACRYSOF IQ 20.0 (Intraocular Lens) ×2 IMPLANT
MARKER SKIN DUAL TIP RULER LAB (MISCELLANEOUS) ×3 IMPLANT
PACK DR. KING ARMS (PACKS) ×3 IMPLANT
PACK EYE AFTER SURG (MISCELLANEOUS) ×3 IMPLANT
PACK OPTHALMIC (MISCELLANEOUS) ×3 IMPLANT
SYR 3ML LL SCALE MARK (SYRINGE) ×3 IMPLANT
SYR TB 1ML LUER SLIP (SYRINGE) ×3 IMPLANT
WATER STERILE IRR 500ML POUR (IV SOLUTION) ×3 IMPLANT
WIPE NON LINTING 3.25X3.25 (MISCELLANEOUS) ×3 IMPLANT

## 2018-05-30 NOTE — Anesthesia Procedure Notes (Signed)
Procedure Name: MAC Performed by: Ahnaf Caponi, CRNA Pre-anesthesia Checklist: Patient identified, Emergency Drugs available, Suction available, Timeout performed and Patient being monitored Patient Re-evaluated:Patient Re-evaluated prior to induction Oxygen Delivery Method: Nasal cannula Placement Confirmation: positive ETCO2       

## 2018-05-30 NOTE — Anesthesia Preprocedure Evaluation (Signed)
Anesthesia Evaluation  Patient identified by MRN, date of birth, ID band Patient awake    Reviewed: Allergy & Precautions, H&P , NPO status , Patient's Chart, lab work & pertinent test results  Airway Mallampati: II  TM Distance: >3 FB Neck ROM: full    Dental no notable dental hx.    Pulmonary neg pulmonary ROS,    Pulmonary exam normal breath sounds clear to auscultation       Cardiovascular hypertension, On Medications Normal cardiovascular exam Rhythm:regular Rate:Normal     Neuro/Psych negative neurological ROS     GI/Hepatic Neg liver ROS, Medicated,  Endo/Other  negative endocrine ROS  Renal/GU negative Renal ROS  negative genitourinary   Musculoskeletal   Abdominal   Peds  Hematology negative hematology ROS (+)   Anesthesia Other Findings   Reproductive/Obstetrics                            Anesthesia Physical Anesthesia Plan  ASA: II  Anesthesia Plan: MAC   Post-op Pain Management:    Induction:   PONV Risk Score and Plan:   Airway Management Planned:   Additional Equipment:   Intra-op Plan:   Post-operative Plan:   Informed Consent: I have reviewed the patients History and Physical, chart, labs and discussed the procedure including the risks, benefits and alternatives for the proposed anesthesia with the patient or authorized representative who has indicated his/her understanding and acceptance.       Plan Discussed with:   Anesthesia Plan Comments:         Anesthesia Quick Evaluation

## 2018-05-30 NOTE — Op Note (Signed)
OPERATIVE NOTE  Jennifer [name] [id number] 05/30/2018   PREOPERATIVE DIAGNOSIS:  Nuclear sclerotic cataract left eye.  H25.12   POSTOPERATIVE DIAGNOSIS:    Nuclear sclerotic cataract left eye.     PROCEDURE:  Phacoemusification with posterior chamber intraocular lens placement of the left eye   LENS:   Implant Name Type Inv. Item Serial No. Manufacturer Lot No. LRB No. Used  LENS IOL ACRYSOF IQ 20.0 - [id number] Intraocular Lens LENS IOL ACRYSOF IQ 20.0 [id number] ALCON  Left 1       AU00T0 +20.0   ULTRASOUND TIME: 0 minutes 43 seconds.  CDE 5.58   SURGEON:  Willey Blade, MD, MPH   ANESTHESIA:  Topical with tetracaine drops augmented with 1% preservative-free intracameral lidocaine.  ESTIMATED BLOOD LOSS: <1 mL   COMPLICATIONS:  None.   DESCRIPTION OF PROCEDURE:  The patient was identified in the holding room and transported to the operating room and placed in the supine position under the operating microscope.  The left eye was identified as the operative eye and it was prepped and draped in the usual sterile ophthalmic fashion.   A 1.0 millimeter clear-corneal paracentesis was made at the 5:00 position. 0.5 ml of preservative-free 1% lidocaine with epinephrine was injected into the anterior chamber.  The anterior chamber was filled with Healon 5 viscoelastic.  A 2.4 millimeter keratome was used to make a near-clear corneal incision at the 2:00 position.  A curvilinear capsulorrhexis was made with a cystotome and capsulorrhexis forceps.  Balanced salt solution was used to hydrodissect and hydrodelineate the nucleus.   Phacoemulsification was then used in stop and chop fashion to remove the lens nucleus and epinucleus.  The remaining cortex was then removed using the irrigation and aspiration handpiece. Healon was then placed into the capsular bag to distend it for lens placement.  A lens was then injected into the capsular bag.  The remaining viscoelastic was aspirated.    Wounds were hydrated with balanced salt solution.  The anterior chamber was inflated to a physiologic pressure with balanced salt solution.  Intracameral vigamox 0.1 mL undiltued was injected into the eye and a drop placed onto the ocular surface.  No wound leaks were noted.  The patient was taken to the recovery room in stable condition without complications of anesthesia or surgery  Willey Blade 05/30/2018, 8:35 AM

## 2018-05-30 NOTE — Transfer of Care (Signed)
Immediate Anesthesia Transfer of Care Note  Patient: Jennifer [name]  Procedure(s) Performed: CATARACT EXTRACTION PHACO AND INTRAOCULAR LENS PLACEMENT (IOC) LEFT (Left Eye)  Patient Location: PACU  Anesthesia Type: MAC  Level of Consciousness: awake, alert  and patient cooperative  Airway and Oxygen Therapy: Patient Spontanous Breathing and Patient connected to supplemental oxygen  Post-op Assessment: Post-op Vital signs reviewed, Patient's Cardiovascular Status Stable, Respiratory Function Stable, Patent Airway and No signs of Nausea or vomiting  Post-op Vital Signs: Reviewed and stable  Complications: No apparent anesthesia complications

## 2018-05-30 NOTE — Anesthesia Postprocedure Evaluation (Signed)
Anesthesia Post Note  Patient: Garment/textile technologist  Procedure(s) Performed: CATARACT EXTRACTION PHACO AND INTRAOCULAR LENS PLACEMENT (IOC) LEFT (Left Eye)  Patient location during evaluation: PACU Anesthesia Type: MAC Level of consciousness: awake and alert Pain management: pain level controlled Vital Signs Assessment: post-procedure vital signs reviewed and stable Respiratory status: spontaneous breathing Cardiovascular status: stable Anesthetic complications: no    Hazelyn Kallen, III,  Omah Dewalt D

## 2018-05-30 NOTE — H&P (Signed)

## 2018-05-31 ENCOUNTER — Encounter: Payer: Self-pay | Admitting: Ophthalmology

## 2020-01-21 IMAGING — CR DG THORACIC SPINE 2V
3 series · 3 of 3 positions shown · non-contrast
Comparison: 03/14/2016, 03/01/2016

CLINICAL DATA: 86-year-old female with a history of left back pain
for 5 days

EXAM:
THORACIC SPINE 2 VIEWS

[t-spine ap]
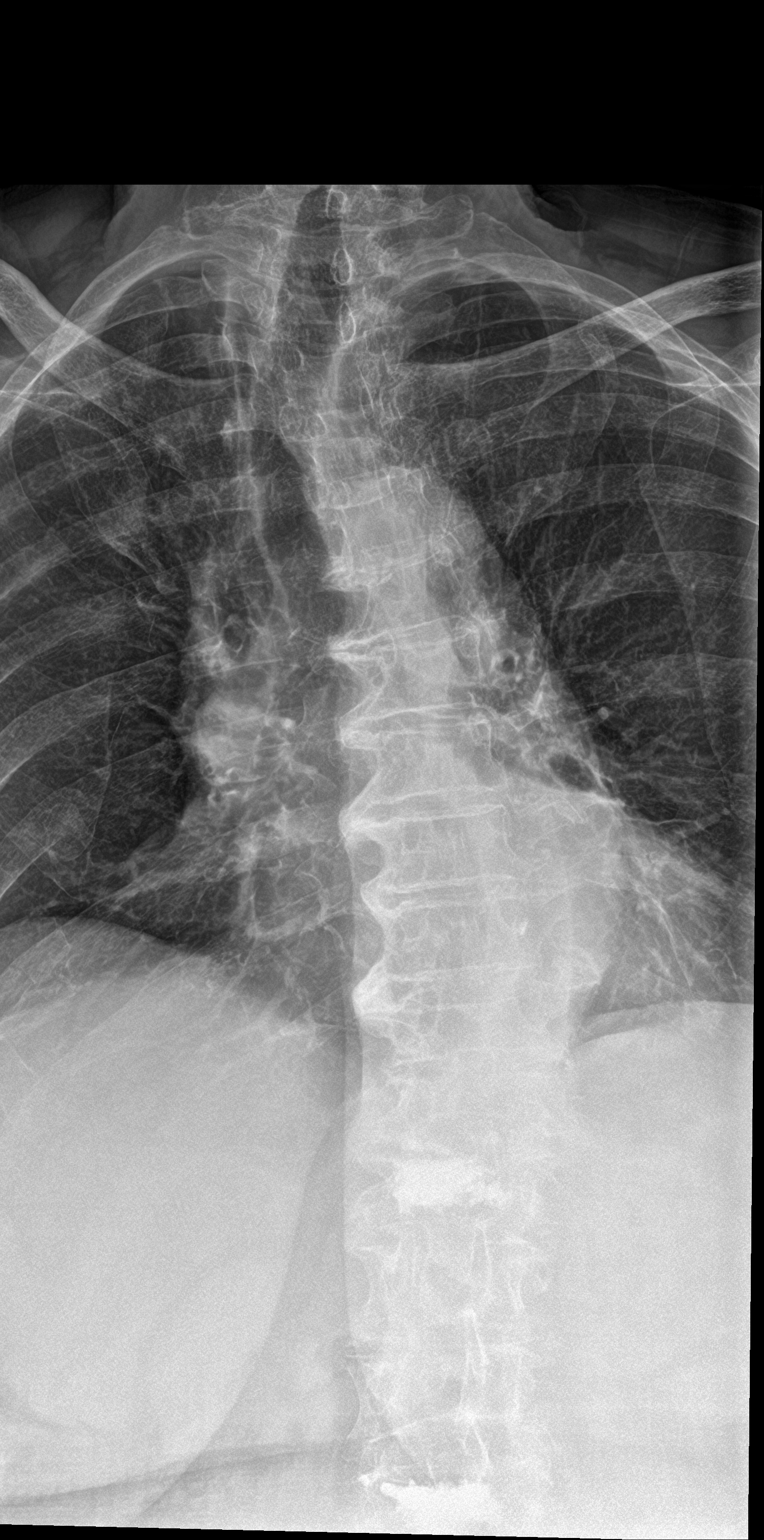

[t-spine lat]
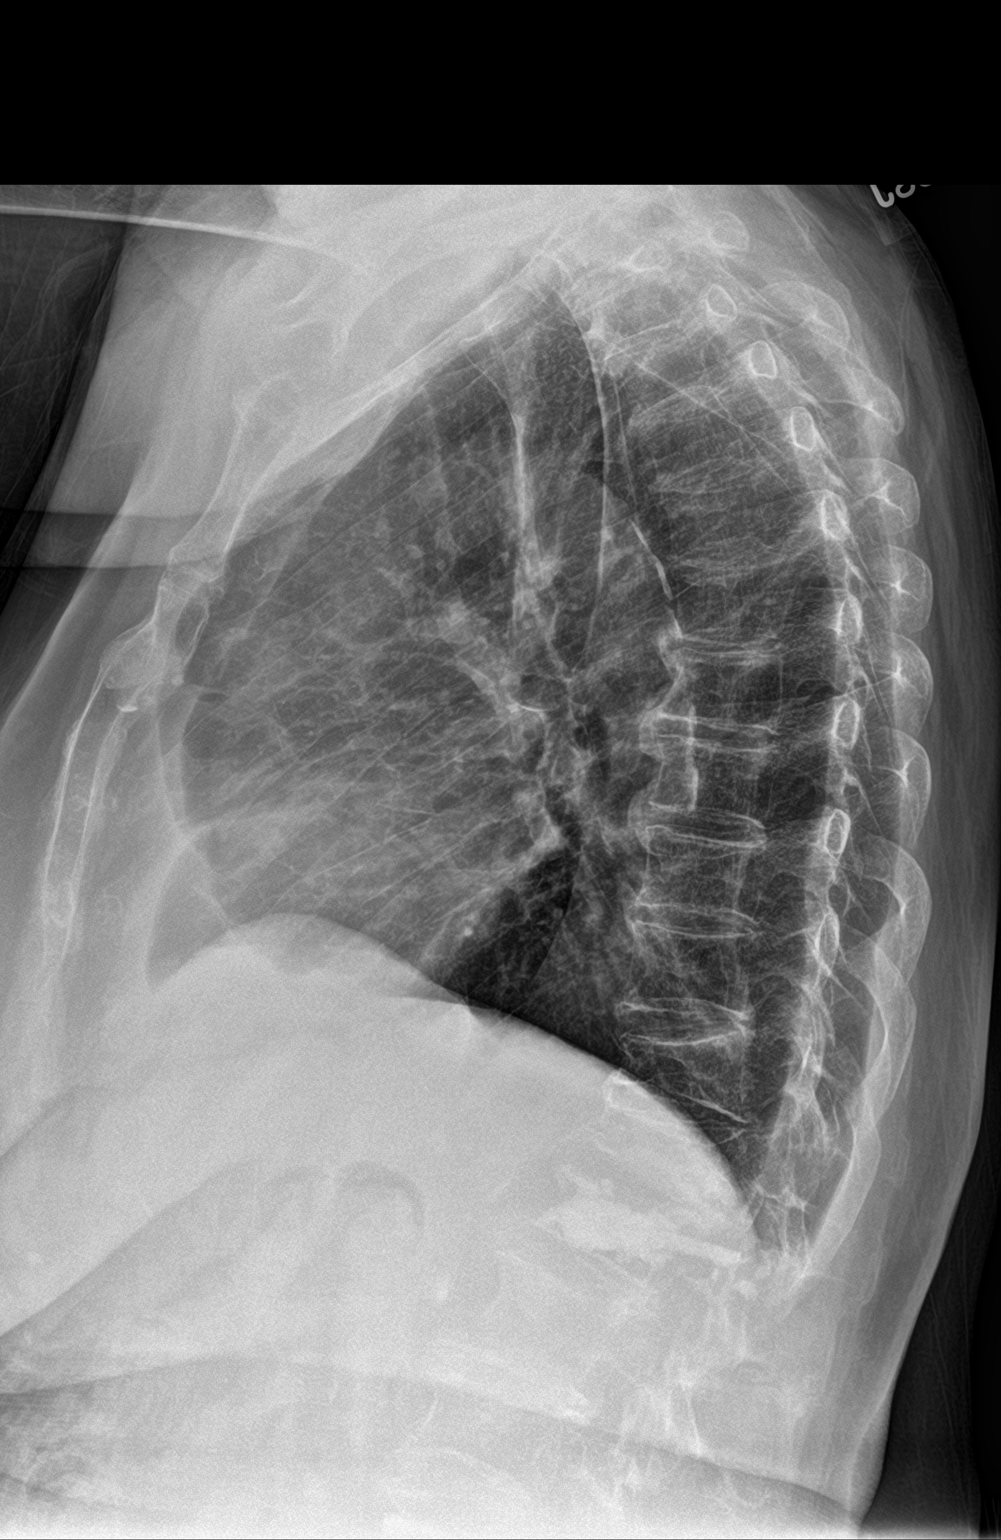

[t-spine swimmers]
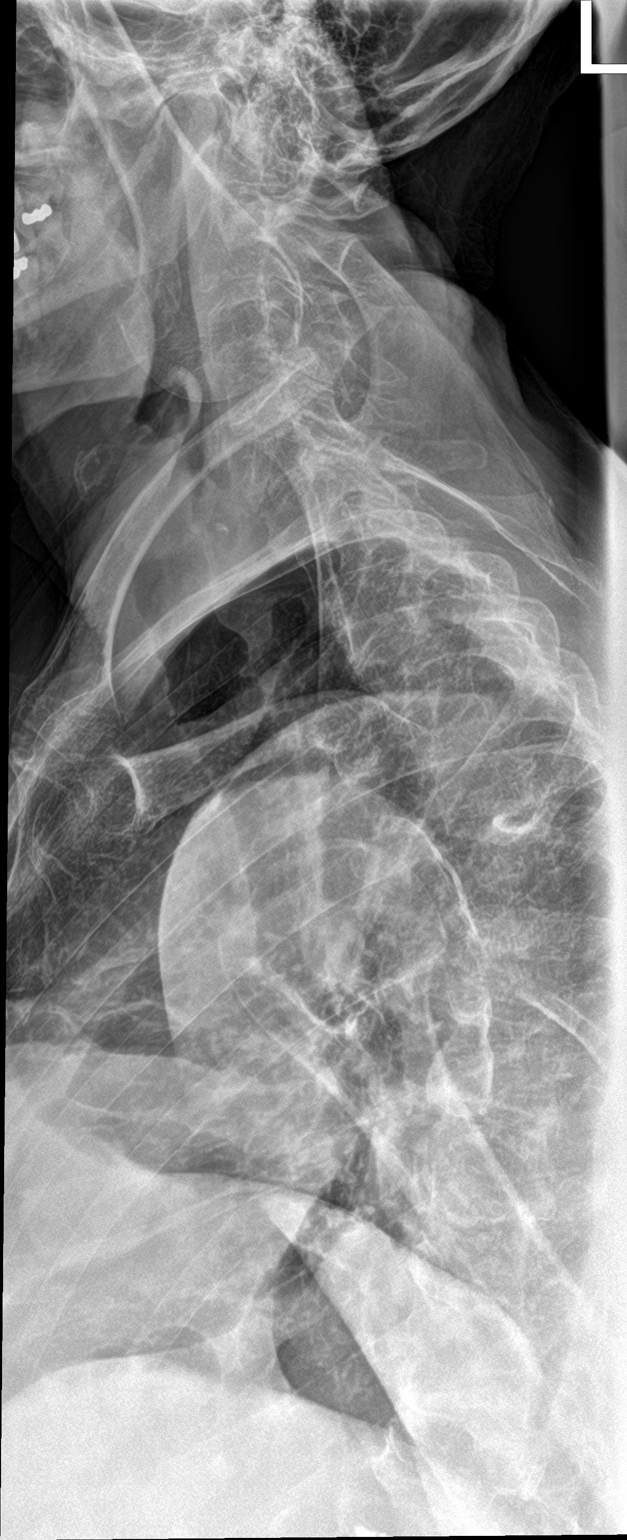

[3 of 3 positions shown; findings below may reference images not displayed]

FINDINGS: Thoracic Spine:

Thoracic vertebral elements maintain previous alignment. No
subluxation. Scoliotic curvature again noted.

Osteopenia.

No acute fracture line identified.

Redemonstration of fractures of T11 and T12 with changes of
vertebroplasty at L1. No further height loss as compared to the
prior chest x-ray of 03/14/2016. No new fracture identified. Chronic
posttraumatic deformity of the sternum.

Multilevel degenerative changes throughout the thoracic spine.

Unremarkable appearance of the visualized thorax.
IMPRESSION: No acute fracture or malalignment of the thoracic spine.

Redemonstration of T11 and T12 compression fracture as well as
changes of vertebral augmentation of L1. Configuration similar to
that of the plain film dated 03/14/2016.

Osteopenia and degenerative changes.

## 2020-06-17 ENCOUNTER — Inpatient Hospital Stay
Admission: EM | Admit: 2020-06-17 | Discharge: 2020-06-19 | DRG: 392 | Disposition: A | Discharge status: Home Health | Payer: Medicare Other | Attending: Internal Medicine | Admitting: Internal Medicine

## 2020-06-17 ENCOUNTER — Emergency Department: Payer: Medicare Other

## 2020-06-17 DIAGNOSIS — K529 Noninfective gastroenteritis and colitis, unspecified: Principal | ICD-10-CM | POA: Diagnosis present

## 2020-06-17 DIAGNOSIS — R112 Nausea with vomiting, unspecified: Secondary | ICD-10-CM

## 2020-06-17 DIAGNOSIS — Z8249 Family history of ischemic heart disease and other diseases of the circulatory system: Secondary | ICD-10-CM

## 2020-06-17 DIAGNOSIS — K219 Gastro-esophageal reflux disease without esophagitis: Secondary | ICD-10-CM | POA: Diagnosis present

## 2020-06-17 DIAGNOSIS — M1990 Unspecified osteoarthritis, unspecified site: Secondary | ICD-10-CM | POA: Diagnosis present

## 2020-06-17 DIAGNOSIS — Z79899 Other long term (current) drug therapy: Secondary | ICD-10-CM | POA: Diagnosis not present

## 2020-06-17 DIAGNOSIS — Z8612 Personal history of poliomyelitis: Secondary | ICD-10-CM

## 2020-06-17 DIAGNOSIS — R197 Diarrhea, unspecified: Secondary | ICD-10-CM | POA: Diagnosis not present

## 2020-06-17 DIAGNOSIS — Z7901 Long term (current) use of anticoagulants: Secondary | ICD-10-CM | POA: Diagnosis not present

## 2020-06-17 DIAGNOSIS — E860 Dehydration: Secondary | ICD-10-CM | POA: Diagnosis present

## 2020-06-17 DIAGNOSIS — I480 Paroxysmal atrial fibrillation: Secondary | ICD-10-CM | POA: Diagnosis present

## 2020-06-17 DIAGNOSIS — I10 Essential (primary) hypertension: Secondary | ICD-10-CM | POA: Diagnosis present

## 2020-06-17 DIAGNOSIS — I4891 Unspecified atrial fibrillation: Secondary | ICD-10-CM | POA: Diagnosis present

## 2020-06-17 DIAGNOSIS — Z20822 Contact with and (suspected) exposure to covid-19: Secondary | ICD-10-CM | POA: Diagnosis present

## 2020-06-17 DIAGNOSIS — E785 Hyperlipidemia, unspecified: Secondary | ICD-10-CM | POA: Diagnosis present

## 2020-06-17 DIAGNOSIS — E876 Hypokalemia: Secondary | ICD-10-CM | POA: Diagnosis not present

## 2020-06-17 LAB — URINALYSIS, COMPLETE (UACMP) WITH MICROSCOPIC
Bilirubin Urine: NEGATIVE
Glucose, UA: NEGATIVE mg/dL
Hgb urine dipstick: NEGATIVE
Ketones, ur: 80 mg/dL — AB
Leukocytes,Ua: NEGATIVE
Nitrite: NEGATIVE
Protein, ur: NEGATIVE mg/dL
Specific Gravity, Urine: 1.019 (ref 1.005–1.030)
pH: 5 (ref 5.0–8.0)

## 2020-06-17 LAB — CBC WITH DIFFERENTIAL/PLATELET
Abs Immature Granulocytes: 0.02 10*3/uL (ref 0.00–0.07)
Basophils Absolute: 0 10*3/uL (ref 0.0–0.1)
Basophils Relative: 0 %
Eosinophils Absolute: 0 10*3/uL (ref 0.0–0.5)
Eosinophils Relative: 0 %
HCT: 40 % (ref 36.0–46.0)
Hemoglobin: 12.9 g/dL (ref 12.0–15.0)
Immature Granulocytes: 0 %
Lymphocytes Relative: 5 %
Lymphs Abs: 0.4 10*3/uL — ABNORMAL LOW (ref 0.7–4.0)
MCH: 30.8 pg (ref 26.0–34.0)
MCHC: 32.3 g/dL (ref 30.0–36.0)
MCV: 95.5 fL (ref 80.0–100.0)
Monocytes Absolute: 0.4 10*3/uL (ref 0.1–1.0)
Monocytes Relative: 5 %
Neutro Abs: 6.7 10*3/uL (ref 1.7–7.7)
Neutrophils Relative %: 90 %
Platelets: 233 10*3/uL (ref 150–400)
RBC: 4.19 MIL/uL (ref 3.87–5.11)
RDW: 14.1 % (ref 11.5–15.5)
WBC: 7.6 10*3/uL (ref 4.0–10.5)
nRBC: 0 % (ref 0.0–0.2)

## 2020-06-17 LAB — MAGNESIUM
Magnesium: 1.7 mg/dL (ref 1.7–2.4)
Magnesium: 1.7 mg/dL (ref 1.7–2.4)

## 2020-06-17 LAB — COMPREHENSIVE METABOLIC PANEL
ALT: 26 U/L (ref 0–44)
AST: 28 U/L (ref 15–41)
Albumin: 4.1 g/dL (ref 3.5–5.0)
Alkaline Phosphatase: 94 U/L (ref 38–126)
Anion gap: 9 (ref 5–15)
BUN: 18 mg/dL (ref 8–23)
CO2: 23 mmol/L (ref 22–32)
Calcium: 9.3 mg/dL (ref 8.9–10.3)
Chloride: 107 mmol/L (ref 98–111)
Creatinine, Ser: 0.56 mg/dL (ref 0.44–1.00)
GFR, Estimated: >60 mL/min (ref >60)
Glucose, Bld: 187 mg/dL — ABNORMAL HIGH (ref 70–99)
Potassium: 3.8 mmol/L (ref 3.5–5.1)
Sodium: 139 mmol/L (ref 135–145)
Total Bilirubin: 0.9 mg/dL (ref 0.3–1.2)
Total Protein: 7.3 g/dL (ref 6.5–8.1)

## 2020-06-17 LAB — PHOSPHORUS: Phosphorus: 3 mg/dL (ref 2.5–4.6)

## 2020-06-17 LAB — TROPONIN I (HIGH SENSITIVITY): Troponin I (High Sensitivity): 8 ng/L (ref <18)

## 2020-06-17 LAB — LIPASE, BLOOD: Lipase: 35 U/L (ref 11–51)

## 2020-06-17 LAB — RESP PANEL BY RT-PCR (FLU A&B, COVID) ARPGX2
Influenza A by PCR: NEGATIVE
Influenza B by PCR: NEGATIVE
SARS Coronavirus 2 by RT PCR: NEGATIVE

## 2020-06-17 LAB — TSH: TSH: 0.703 u[IU]/mL (ref 0.350–4.500)

## 2020-06-17 MED ORDER — SODIUM CHLORIDE 0.9 % IV SOLN: INTRAVENOUS | Status: DC

## 2020-06-17 MED ORDER — ACETAMINOPHEN 650 MG RE SUPP: 650.0000 mg | Freq: Four times a day (QID) | RECTAL | Status: DC | PRN

## 2020-06-17 MED ORDER — ONDANSETRON HCL 4 MG/2ML IJ SOLN
4.0000 mg | Freq: Four times a day (QID) | INTRAMUSCULAR | Status: DC | PRN
  Administered 2020-06-18: 4 mg via INTRAVENOUS
  Filled 2020-06-17: qty 2

## 2020-06-17 MED ORDER — DILTIAZEM HCL-DEXTROSE 125-5 MG/125ML-% IV SOLN (PREMIX)
5.0000 mg/h | INTRAVENOUS | Status: DC
  Administered 2020-06-18: 5 mg/h via INTRAVENOUS
  Filled 2020-06-17 (×2): qty 125

## 2020-06-17 MED ORDER — SENNOSIDES-DOCUSATE SODIUM 8.6-50 MG PO TABS: 1.0000 | ORAL_TABLET | Freq: Every evening | ORAL | Status: DC | PRN

## 2020-06-17 MED ORDER — TRAZODONE HCL 50 MG PO TABS: 25.0000 mg | ORAL_TABLET | Freq: Every evening | ORAL | Status: DC | PRN

## 2020-06-17 MED ORDER — SIMVASTATIN 20 MG PO TABS
10.0000 mg | ORAL_TABLET | Freq: Every day | ORAL | Status: DC
  Administered 2020-06-18 (×2): 10 mg via ORAL
  Filled 2020-06-17 (×2): qty 1

## 2020-06-17 MED ORDER — ALBUTEROL SULFATE (2.5 MG/3ML) 0.083% IN NEBU: 2.5000 mg | INHALATION_SOLUTION | Freq: Four times a day (QID) | RESPIRATORY_TRACT | Status: DC | PRN

## 2020-06-17 MED ORDER — DILTIAZEM HCL ER COATED BEADS 120 MG PO CP24
240.0000 mg | ORAL_CAPSULE | Freq: Every day | ORAL | Status: DC
  Administered 2020-06-18 – 2020-06-19 (×3): 240 mg via ORAL
  Filled 2020-06-17 (×2): qty 2
  Filled 2020-06-17 (×2): qty 1

## 2020-06-17 MED ORDER — MAGNESIUM CITRATE PO SOLN
1.0000 | Freq: Once | ORAL | Status: DC | PRN
  Filled 2020-06-17: qty 296

## 2020-06-17 MED ORDER — LISINOPRIL 20 MG PO TABS
40.0000 mg | ORAL_TABLET | Freq: Every day | ORAL | Status: DC
  Administered 2020-06-18: 40 mg via ORAL
  Filled 2020-06-17: qty 4

## 2020-06-17 MED ORDER — ONDANSETRON HCL 4 MG PO TABS: 4.0000 mg | ORAL_TABLET | Freq: Four times a day (QID) | ORAL | Status: DC | PRN

## 2020-06-17 MED ORDER — BISACODYL 5 MG PO TBEC: 5.0000 mg | DELAYED_RELEASE_TABLET | Freq: Every day | ORAL | Status: DC | PRN

## 2020-06-17 MED ORDER — DILTIAZEM HCL 25 MG/5ML IV SOLN
20.0000 mg | Freq: Once | INTRAVENOUS | Status: AC
  Administered 2020-06-17: 20 mg via INTRAVENOUS
  Filled 2020-06-17: qty 5

## 2020-06-17 MED ORDER — ACETAMINOPHEN 500 MG PO TABS
1000.0000 mg | ORAL_TABLET | Freq: Once | ORAL | Status: AC
  Administered 2020-06-17: 1000 mg via ORAL
  Filled 2020-06-17: qty 2

## 2020-06-17 MED ORDER — LACTATED RINGERS IV BOLUS
1000.0000 mL | Freq: Once | INTRAVENOUS | Status: AC
  Administered 2020-06-17: 1000 mL via INTRAVENOUS

## 2020-06-17 MED ORDER — RIVAROXABAN 20 MG PO TABS
20.0000 mg | ORAL_TABLET | Freq: Every day | ORAL | Status: DC
  Administered 2020-06-18: 20 mg via ORAL
  Filled 2020-06-17: qty 1

## 2020-06-17 MED ORDER — ACETAMINOPHEN 325 MG PO TABS
650.0000 mg | ORAL_TABLET | Freq: Four times a day (QID) | ORAL | Status: DC | PRN
  Administered 2020-06-18 (×2): 650 mg via ORAL
  Filled 2020-06-17 (×3): qty 2

## 2020-06-17 MED ORDER — IPRATROPIUM BROMIDE 0.02 % IN SOLN: 0.5000 mg | Freq: Four times a day (QID) | RESPIRATORY_TRACT | Status: DC | PRN

## 2020-06-17 MED ORDER — MORPHINE SULFATE (PF) 2 MG/ML IV SOLN: 1.0000 mg | Freq: Four times a day (QID) | INTRAVENOUS | Status: DC | PRN

## 2020-06-17 MED ORDER — ONDANSETRON HCL 4 MG/2ML IJ SOLN
4.0000 mg | Freq: Once | INTRAMUSCULAR | Status: AC
  Administered 2020-06-17: 4 mg via INTRAVENOUS
  Filled 2020-06-17: qty 2

## 2020-06-17 NOTE — ED Notes (Signed)
Dr. Smith at bedside.

## 2020-06-17 NOTE — ED Provider Notes (Signed)
[facility] Emergency Department Provider Note ____________________________________________   Event Date/Time   First MD Initiated Contact with Patient 06/17/20 1934     (approximate)  I have reviewed the triage vital signs and the nursing notes.  HISTORY  Chief Complaint Vomiting   HPI Saira Symphani Eckstrom is a 87 y.o. femalewho presents to the ED for evaluation of N/V/D  Chart review indicates hx of A. fib on Xarelto, HTN, GERD and HLD.  History of polio affecting her left leg, for which she wears a chronic brace. Patient reports she lives at home alone but has a 24/7 caregiver that helps. She reports that she took her diltiazem this morning, she typically does, and take Xarelto at night.  Patient presents to the ED via EMS from home for evaluation of nausea, vomiting and diarrhea.  Patient reports eating seafood yesterday afternoon at lunchtime with family at a local restaurant, and had no postprandial nausea or abdominal pain.  She reports waking up this morning and having a nausea sensation and cramping with her abdomen.  She reports developing watery diarrhea couple hours later and has had recurrent episodes of watery diarrhea and nonbloody nonbilious emesis throughout the day today.  She estimates about 3-5 episodes of each.  She reports associated presyncopal dizziness without syncope.  She reports that she has some abdominal cramping sensation previously, but is since resolved after passing stool and vomiting.  She reports that she just feels weak at this point and denies any pain.   Past Medical History:  Diagnosis Date  . Arthritis   . Atrial fibrillation ([place])   . GERD (gastroesophageal reflux disease)   . High cholesterol   . Hypertension   . Polio    left leg weakness, wears brace    Patient Active Problem List   Diagnosis Date Noted  . Atrial fibrillation with RVR ([place]) 06/17/2020  . A-fib ([place]) 03/01/2016    Past Surgical History:   Procedure Laterality Date  . BACK SURGERY    . CATARACT EXTRACTION W/PHACO Left 05/30/2018   Procedure: CATARACT EXTRACTION PHACO AND INTRAOCULAR LENS PLACEMENT ([place]) LEFT;  Surgeon: Eulogio Bear, MD;  Location: [place];  Service: Ophthalmology;  Laterality: Left;  . FEMUR FRACTURE SURGERY Right 2010  . HIP SURGERY Left     Prior to Admission medications   Medication Sig Start Date End Date Taking? Authorizing Provider  acetaminophen (TYLENOL) 500 MG tablet Take 500 mg by mouth every 6 (six) hours as needed for pain.     [provider]  Cyanocobalamin (B-12 COMPLIANCE INJECTION) 1000 MCG/ML KIT Inject 1 Dose as directed daily.    [provider]  diltiazem (CARDIZEM CD) 240 MG 24 hr capsule Take 240 mg by mouth daily. 09/15/17   [provider]  ibuprofen (ADVIL,MOTRIN) 600 MG tablet Take 1 tablet (600 mg total) by mouth every 8 (eight) hours as needed for moderate pain (with food). Patient not taking: Reported on 03/14/2016 03/01/15   Eula Listen, MD  lisinopril (PRINIVIL,ZESTRIL) 40 MG tablet Take 1 tablet by mouth daily. 02/25/15   [provider]  omeprazole (PRILOSEC) 20 MG capsule Take 1 capsule by mouth 2 (two) times daily. 03/08/15   [provider]  rivaroxaban (XARELTO) 20 MG TABS tablet Take 1 tablet (20 mg total) by mouth daily with supper. 03/02/16   Dustin Flock, MD  simvastatin (ZOCOR) 10 MG tablet Take 10 mg by mouth at bedtime. 09/11/17   [provider]  Vitamin D, Ergocalciferol, (DRISDOL) 50000 units CAPS capsule Take 1 capsule by mouth every 30 (thirty) days. 03/08/15   [provider]    Allergies Patient has no known allergies.  Family History  Problem Relation Age of Onset  . Stroke Mother   . Heart attack Father   . Diabetes Sister   . Heart attack Sister   . Cancer Brother     Social History Social History   Tobacco Use  . Smoking status: Never Smoker  .  Smokeless tobacco: Never Used  Vaping Use  . Vaping Use: Never used  Substance Use Topics  . Alcohol use: No  . Drug use: No    Review of Systems  Constitutional: No fever/chills.  Positive generalized weakness Eyes: No visual changes. ENT: No sore throat. Cardiovascular: Denies chest pain. Respiratory: Denies shortness of breath. Gastrointestinal:  No constipation. Positive for abdominal cramping, now resolved. Positive for nausea, vomiting and diarrhea. Genitourinary: Negative for dysuria. Musculoskeletal: Negative for back pain. Skin: Negative for rash. Neurological: Negative for headaches, focal weakness or numbness.  ____________________________________________   PHYSICAL EXAM:  VITAL SIGNS: Vitals:   06/17/20 2100 06/17/20 2106  BP: 132/65   Pulse: (!) 101 88  Resp: (!) 28 17  Temp:    SpO2: 98% 97%     Constitutional: Alert and oriented. Well appearing and in no acute distress.  Conversational in full sentences. Eyes: Conjunctivae are normal. PERRL. EOMI. Head: Atraumatic. Nose: No congestion/rhinnorhea. Mouth/Throat: Mucous membranes are dry.  Oropharynx non-erythematous. Neck: No stridor. No cervical spine tenderness to palpation. Cardiovascular: Tachycardic and irregular rhythm. Grossly normal heart sounds.  Good peripheral circulation. Respiratory: Normal respiratory effort.  No retractions. Lungs CTAB. Gastrointestinal: Soft , nondistended, nontender to palpation. No CVA tenderness.  Nontender throughout Musculoskeletal: No lower extremity tenderness nor edema.  No joint effusions. No signs of acute trauma. Chronic brace from her polio noted to left leg Boot on her left foot.  No edema to the lower extremities. Neurologic:  Normal speech and language. No gross focal neurologic deficits are appreciated. Skin:  Skin is warm, dry and intact. No rash noted. Psychiatric: Mood and affect are normal. Speech and behavior are  normal.  ____________________________________________   LABS (all labs ordered are listed, but only abnormal results are displayed)  Labs Reviewed  COMPREHENSIVE METABOLIC PANEL - Abnormal; Notable for the following components:      Result Value   Glucose, Bld 187 (*)    All other components within normal limits  CBC WITH DIFFERENTIAL/PLATELET - Abnormal; Notable for the following components:   Lymphs Abs 0.4 (*)    All other components within normal limits  URINALYSIS, COMPLETE (UACMP) WITH MICROSCOPIC - Abnormal; Notable for the following components:   Color, Urine YELLOW (*)    APPearance CLEAR (*)    Ketones, ur 80 (*)    Bacteria, UA RARE (*)    All other components within normal limits  RESP PANEL BY RT-PCR (FLU A&B, COVID) ARPGX2  LIPASE, BLOOD  MAGNESIUM   ____________________________________________  12 Lead EKG  A. fib with RVR, rate of 131 bpm.  Normal axis and intervals.  No evidence of acute ischemia. ____________________________________________  RADIOLOGY  ED MD interpretation: Plain film of the chest and abdomen without evidence of acute pulmonary infiltrate or SBO  Official radiology report(s): DG Abdomen Acute W/Chest  Result Date: 06/17/2020 CLINICAL DATA:  Fever, nausea, vomiting EXAM: DG ABDOMEN ACUTE WITH 1 VIEW CHEST COMPARISON:  Chest x-ray 04/11/2018 FINDINGS: Heart is  normal size. There is hyperinflation of the lungs compatible with COPD. No confluent opacities or effusions. No evidence of bowel obstruction. No free air organomegaly. No suspicious calcification. Multilevel vertebroplasty changes in the lumbar spine. Prior left hip replacement. No acute bony abnormality. IMPRESSION: COPD. No acute cardiopulmonary disease. No acute findings in the abdomen. Electronically Signed   By: Kevin  Dover M.D.   On: 06/17/2020 20:35    ____________________________________________   PROCEDURES and INTERVENTIONS  Procedure(s) performed (including Critical  Care):  .1-3 Lead EKG Interpretation Performed by: Smith, Dylan, MD Authorized by: Smith, Dylan, MD     Interpretation: abnormal     ECG rate:  131   ECG rate assessment: tachycardic     Rhythm: atrial fibrillation     Ectopy: none     Conduction: normal   .Critical Care Performed by: Smith, Dylan, MD Authorized by: Smith, Dylan, MD   Critical care provider statement:    Critical care time (minutes):  35   Critical care was necessary to treat or prevent imminent or life-threatening deterioration of the following conditions:  Cardiac failure   Critical care was time spent personally by me on the following activities:  Discussions with consultants, evaluation of patient's response to treatment, examination of patient, ordering and performing treatments and interventions, ordering and review of laboratory studies, ordering and review of radiographic studies, pulse oximetry, re-evaluation of patient's condition, obtaining history from patient or surrogate and review of old charts    Medications  diltiazem (CARDIZEM) 125 mg in dextrose 5% 125 mL (1 mg/mL) infusion (0 mg/hr Intravenous Hold 06/17/20 2112)  lactated ringers bolus 1,000 mL (1,000 mLs Intravenous New Bag/Given 06/17/20 1948)  ondansetron (ZOFRAN) injection 4 mg (4 mg Intravenous Given 06/17/20 1948)  acetaminophen (TYLENOL) tablet 1,000 mg (1,000 mg Oral Given 06/17/20 1950)  diltiazem (CARDIZEM) injection 20 mg (20 mg Intravenous Given 06/17/20 1949)    ____________________________________________   MDM / ED COURSE   Pleasant 88-year-old woman with history of A. fib presents to the ED with nausea, vomiting and diarrhea most consistent with likely viral gastroenteritis, precipitating A. fib with RVR, and requiring medical admission for this.  Presents tachycardic in A. fib with rates in the 130s, but hemodynamically stable.  Noted to have a fever of 100.4 with EMS, but no fever here.  Blood work is generally reassuring without  evidence of sepsis, electrolyte derangements or renal dysfunction.  Urinalysis without infectious features, both ketonuria suggestive of dehydration.  Without significant abdominal pain, I see no indication for CT imaging, but acute abdominal series was obtained and demonstrates no evidence of pulmonary infiltrates or SBO.  While patient has improved symptoms with IV fluid resuscitation she remains tachycardic despite diltiazem IV bolus and her home medications, so diltiazem drip was initiated.  Shortly after admission, patient converts to a sinus rhythm in the 90s - 100s.  I do discussed with her the possibility of discharge from the ED, but she reports that her 24/7 caregiver is no longer at home because patient told her to leave due to medical admission.  Due to this, patient reports discomfort going home and requested observation for further fluid resuscitation and monitoring, which I think is reasonable.   Clinical Course as of 06/17/20 2143  Mon Jun 17, 2020  2030 Reassessed.  Patient reports improving symptoms with IV fluids.  We discussed her persistent tachycardia and A. fib with RVR.  We discussed my recommendations for medical admission.  She is agreeable.  Heart rates remain   110-120 after the diltiazem bolus prior to drip [DS]  2108 I discussed the case with hospitalist, who agrees to admit [DS]  2111 Immediately after this, nurse informs me that patient seems to have converted to sinus rhythm with rates in the 80s.  I reviewed repeat EKG which does demonstrate a sinus rhythm with rates of 89 bpm without ischemia.  Patient continues to look well. [DS]  2115 I go to reassess the patient and note heart rates in the 90s-low 100s that appear to be a sinus rhythm on the monitor.  She reports feeling better.  As per discussing, her heart rate jumps to the low 100s and appears to still be sinus.  She reports that she does not have anyone at home, such as her caregiver.  And does not feel comfortable  going home due to this, I think this is reasonable to keep her for an observation admission for rehydration. [DS]  2142 I sent a secure chat message to the admitting hospitalist with these updates. [DS]    Clinical Course User Index [DS] Smith, Dylan, MD    ____________________________________________   FINAL CLINICAL IMPRESSION(S) / ED DIAGNOSES  Final diagnoses:  Atrial fibrillation with RVR ([facility])  Nausea vomiting and diarrhea     ED Discharge Orders    None       Dylan Smith   Note:  This document was prepared using Dragon voice recognition software and may include unintentional dictation errors.   Smith, Dylan, MD 06/17/20 2146  

## 2020-06-17 NOTE — ED Triage Notes (Signed)
Patient sent to ER for nausea, vomiting and diarrhea for 1 day post eating seafood.. Patient A&OX3. Low grade fever in route to ER.

## 2020-06-17 NOTE — ED Notes (Signed)
Patient transported to X-ray 

## 2020-06-17 NOTE — ED Notes (Signed)
Patient returned from XR, remains in afib HR 112, MD aware.

## 2020-06-17 NOTE — H&P (Signed)
History and Physical   [facility] [place] @ [facility] Admission History and Physical [employer], D.O.    Patient Name: Jennifer [name] MR#: [id number] Date of Birth: 14-Mar-1932 Date of Admission: 06/17/2020  Referring MD/NP/PA: Dr. Tamala Julian Primary Care Physician: Ezequiel Kayser, MD  Chief Complaint:  Chief Complaint  Patient presents with  . Vomiting    HPI: Jennifer [name] is a 87 y.o. female with a known history of afib on Xarelto, HTN, HLD, GERD, polio presents to the emergency department for evaluation of nausea, NBNB vomiting and watery diarrhea without abdominal pain for one day. States that she ate seafood yesterday.  She had associated dizziness and lightheadedness but is feeling better now.  She was initially found to be in afib with RVR, was started on a dilt drip in the ER and was able to be titrated off.    Patient denies fevers/chills, weakness, chest pain, shortness of breath, abdominal pain, dysuria/frequency, changes in mental status.    Otherwise there has been no change in status. Patient has been taking medication as prescribed and there has been no recent change in medication or diet.  No recent antibiotics.  There has been no recent illness, hospitalizations, travel or sick contacts.    EMS/ED Course: Patient received diltiazem bolus and drip. Medical admission has been requested for further management of afib with RVR, dehydration, viral gastroenteritis.  Review of Systems:  CONSTITUTIONAL: Positive dizziness, lightheadedness. No fever/chills, fatigue, weakness, weight gain/loss, headache. EYES: No blurry or double vision. ENT: No tinnitus, postnasal drip, redness or soreness of the oropharynx. RESPIRATORY: No cough, dyspnea, wheeze.  No hemoptysis.  CARDIOVASCULAR: No chest pain, palpitations, syncope, orthopnea. No lower extremity edema.  GASTROINTESTINAL: Positive nausea, vomiting, diarrhea, no abdominal pain or constipation.  No hematemesis, melena or  hematochezia. GENITOURINARY: No dysuria, frequency, hematuria. ENDOCRINE: No polyuria or nocturia. No heat or cold intolerance. HEMATOLOGY: No anemia, bruising, bleeding. INTEGUMENTARY: No rashes, ulcers, lesions. MUSCULOSKELETAL: No arthritis, gout. NEUROLOGIC: No numbness, tingling, ataxia, seizure-type activity, weakness. PSYCHIATRIC: No anxiety, depression, insomnia.   Past Medical History:  Diagnosis Date  . Arthritis   . Atrial fibrillation ([place])   . GERD (gastroesophageal reflux disease)   . High cholesterol   . Hypertension   . Polio    left leg weakness, wears brace    Past Surgical History:  Procedure Laterality Date  . BACK SURGERY    . CATARACT EXTRACTION W/PHACO Left 05/30/2018   Procedure: CATARACT EXTRACTION PHACO AND INTRAOCULAR LENS PLACEMENT ([place]) LEFT;  Surgeon: Eulogio Bear, MD;  Location: [place];  Service: Ophthalmology;  Laterality: Left;  . FEMUR FRACTURE SURGERY Right 2010  . HIP SURGERY Left      reports that she has never smoked. She has never used smokeless tobacco. She reports that she does not drink alcohol and does not use drugs.  No Known Allergies  Family History  Problem Relation Age of Onset  . Stroke Mother   . Heart attack Father   . Diabetes Sister   . Heart attack Sister   . Cancer Brother     Prior to Admission medications   Medication Sig Start Date End Date Taking? Authorizing Provider  acetaminophen (TYLENOL) 500 MG tablet Take 500 mg by mouth every 6 (six) hours as needed for pain.     [provider]  Cyanocobalamin (B-12 COMPLIANCE INJECTION) 1000 MCG/ML KIT Inject 1 Dose as directed daily.    [provider]  diltiazem (CARDIZEM CD) 240 MG  24 hr capsule Take 240 mg by mouth daily. 09/15/17   [provider]  ibuprofen (ADVIL,MOTRIN) 600 MG tablet Take 1 tablet (600 mg total) by mouth every 8 (eight) hours as needed for moderate pain (with food). Patient not taking: Reported on  03/14/2016 03/01/15   Rockne Menghini, MD  lisinopril (PRINIVIL,ZESTRIL) 40 MG tablet Take 1 tablet by mouth daily. 02/25/15   [provider]  omeprazole (PRILOSEC) 20 MG capsule Take 1 capsule by mouth 2 (two) times daily. 03/08/15   [provider]  rivaroxaban (XARELTO) 20 MG TABS tablet Take 1 tablet (20 mg total) by mouth daily with supper. 03/02/16   Auburn Bilberry, MD  simvastatin (ZOCOR) 10 MG tablet Take 10 mg by mouth at bedtime. 09/11/17   [provider]  Vitamin D, Ergocalciferol, (DRISDOL) 50000 units CAPS capsule Take 1 capsule by mouth every 30 (thirty) days. 03/08/15   [provider]    Physical Exam: Vitals:   06/17/20 1951 06/17/20 1953 06/17/20 2000 06/17/20 2025  BP:  117/63 (!) 154/77   Pulse:  95 (!) 123 (!) 101  Resp: 20 20 (!) 24 19  Temp:      TempSrc:      SpO2:  94% 94% 97%  Weight:      Height:        GENERAL: 87 y.o.-year-old female patient, well-developed, well-nourished lying in the bed in no acute distress.  Pleasant and cooperative.   HEENT: Head atraumatic, normocephalic. Pupils equal. Mucus membranes moist. NECK: Supple. No JVD. CHEST: Normal breath sounds bilaterally. No wheezing, rales, rhonchi or crackles. No use of accessory muscles of respiration.  No reproducible chest wall tenderness.  CARDIOVASCULAR: S1, S2 normal. No murmurs, rubs, or gallops. Cap refill <2 seconds. Pulses intact distally.  ABDOMEN: Soft, nondistended, nontender. No rebound, guarding, rigidity. Normoactive bowel sounds present in all four quadrants.  EXTREMITIES: LLE braced. No pedal edema, cyanosis, or clubbing. No calf tenderness or Homan's sign.  NEUROLOGIC: The patient is alert and oriented x 3. Cranial nerves II through XII are grossly intact with no focal sensorimotor deficit. PSYCHIATRIC:  Normal affect, mood, thought content. SKIN: Warm, dry, and intact without obvious rash, lesion, or ulcer.    Labs on  Admission:  CBC: Recent Labs  Lab 06/17/20 1935  WBC 7.6  NEUTROABS 6.7  HGB 12.9  HCT 40.0  MCV 95.5  PLT 233   Basic Metabolic Panel: Recent Labs  Lab 06/17/20 1935 06/17/20 1944  NA 139  --   K 3.8  --   CL 107  --   CO2 23  --   GLUCOSE 187*  --   BUN 18  --   CREATININE 0.56  --   CALCIUM 9.3  --   MG  --  1.7   GFR: Estimated Creatinine Clearance: 39.4 mL/min (by C-G formula based on SCr of 0.56 mg/dL). Liver Function Tests: Recent Labs  Lab 06/17/20 1935  AST 28  ALT 26  ALKPHOS 94  BILITOT 0.9  PROT 7.3  ALBUMIN 4.1   Recent Labs  Lab 06/17/20 1935  LIPASE 35   No results for input(s): AMMONIA in the last 168 hours. Coagulation Profile: No results for input(s): INR, PROTIME in the last 168 hours. Cardiac Enzymes: No results for input(s): CKTOTAL, CKMB, CKMBINDEX, TROPONINI in the last 168 hours. BNP (last 3 results) No results for input(s): PROBNP in the last 8760 hours. HbA1C: No results for input(s): HGBA1C in the last 72 hours. CBG:  No results for input(s): GLUCAP in the last 168 hours. Lipid Profile: No results for input(s): CHOL, HDL, LDLCALC, TRIG, CHOLHDL, LDLDIRECT in the last 72 hours. Thyroid Function Tests: No results for input(s): TSH, T4TOTAL, FREET4, T3FREE, THYROIDAB in the last 72 hours. Anemia Panel: No results for input(s): VITAMINB12, FOLATE, FERRITIN, TIBC, IRON, RETICCTPCT in the last 72 hours. Urine analysis:    Component Value Date/Time   COLORURINE YELLOW (A) 06/17/2020 1935   APPEARANCEUR CLEAR (A) 06/17/2020 1935   LABSPEC 1.019 06/17/2020 1935   PHURINE 5.0 06/17/2020 1935   GLUCOSEU NEGATIVE 06/17/2020 1935   HGBUR NEGATIVE 06/17/2020 [place] NEGATIVE 06/17/2020 1935   KETONESUR 80 (A) 06/17/2020 1935   PROTEINUR NEGATIVE 06/17/2020 1935   NITRITE NEGATIVE 06/17/2020 1935   LEUKOCYTESUR NEGATIVE 06/17/2020 1935   Sepsis Labs: _0 (procalcitonin:4,lacticidven:4) )No results found for  this or any previous visit (from the past 240 hour(s)).   Radiological Exams on Admission: DG Abdomen Acute W/Chest  Result Date: 06/17/2020 CLINICAL DATA:  Fever, nausea, vomiting EXAM: DG ABDOMEN ACUTE WITH 1 VIEW CHEST COMPARISON:  Chest x-ray 04/11/2018 FINDINGS: Heart is normal size. There is hyperinflation of the lungs compatible with COPD. No confluent opacities or effusions. No evidence of bowel obstruction. No free air organomegaly. No suspicious calcification. Multilevel vertebroplasty changes in the lumbar spine. Prior left hip replacement. No acute bony abnormality. IMPRESSION: COPD. No acute cardiopulmonary disease. No acute findings in the abdomen. Electronically Signed   By: Rolm Baptise M.D.   On: 06/17/2020 20:35    EKG: Afib at 131 bpm with normal axis and nonspecific ST-T wave changes.   Assessment/Plan  This is a 87 y.o. female with a history of afib on Xarelto, HTN, HLD, GERD, polio now being admitted with:  #. Afib with RVR - Admit PCR - Dilt drip PRN - Resume home dilt, Xarelto  #. Dehydration 2/2 gastroenteritis - Gentle IVFs - IV Protonix  #. History of HTN - Continue lisinopril  #. History of HLD - Continue Zocor  Admission status: IP tele IV Fluids: NS Diet/Nutrition: heart healthy Consults called: none  DVT Px: Xarelto and early ambulation. Code Status: Full Code  Disposition Plan: To home in 1-2 days  All the records are reviewed and case discussed with ED provider. Management plans discussed with the patient and/or family who express understanding and agree with plan of care.  Kirby Cortese D.O. on 06/17/2020 at 9:06 PM CC: Primary care physician; Ezequiel Kayser, MD   06/17/2020, 9:06 PM

## 2020-06-17 NOTE — ED Notes (Signed)
Patient noted to be in NSR, repeat EKG performed and given to MD

## 2020-06-18 ENCOUNTER — Other Ambulatory Visit: Payer: Self-pay

## 2020-06-18 ENCOUNTER — Encounter: Payer: Self-pay | Admitting: Family Medicine

## 2020-06-18 LAB — COMPREHENSIVE METABOLIC PANEL
ALT: 22 U/L (ref 0–44)
AST: 21 U/L (ref 15–41)
Albumin: 3.2 g/dL — ABNORMAL LOW (ref 3.5–5.0)
Alkaline Phosphatase: 68 U/L (ref 38–126)
Anion gap: 7 (ref 5–15)
BUN: 14 mg/dL (ref 8–23)
CO2: 23 mmol/L (ref 22–32)
Calcium: 8.5 mg/dL — ABNORMAL LOW (ref 8.9–10.3)
Chloride: 106 mmol/L (ref 98–111)
Creatinine, Ser: 0.51 mg/dL (ref 0.44–1.00)
GFR, Estimated: >60 mL/min (ref >60)
Glucose, Bld: 165 mg/dL — ABNORMAL HIGH (ref 70–99)
Potassium: 3.6 mmol/L (ref 3.5–5.1)
Sodium: 136 mmol/L (ref 135–145)
Total Bilirubin: 0.7 mg/dL (ref 0.3–1.2)
Total Protein: 6.1 g/dL — ABNORMAL LOW (ref 6.5–8.1)

## 2020-06-18 LAB — CBC
HCT: 34.6 % — ABNORMAL LOW (ref 36.0–46.0)
Hemoglobin: 11.4 g/dL — ABNORMAL LOW (ref 12.0–15.0)
MCH: 31.1 pg (ref 26.0–34.0)
MCHC: 32.9 g/dL (ref 30.0–36.0)
MCV: 94.5 fL (ref 80.0–100.0)
Platelets: 226 10*3/uL (ref 150–400)
RBC: 3.66 MIL/uL — ABNORMAL LOW (ref 3.87–5.11)
RDW: 14.2 % (ref 11.5–15.5)
WBC: 6.7 10*3/uL (ref 4.0–10.5)
nRBC: 0 % (ref 0.0–0.2)

## 2020-06-18 LAB — TROPONIN I (HIGH SENSITIVITY): Troponin I (High Sensitivity): 16 ng/L (ref <18)

## 2020-06-18 NOTE — Progress Notes (Signed)
Per MD- ok to stop Cardizem drip after starting oral cardizem.    Stopped at 11:37am.

## 2020-06-18 NOTE — Evaluation (Signed)
Physical Therapy Evaluation Patient Details Name: Jennifer [name] MRN: [id number] DOB: 19-Feb-1932 Today's Date: 06/18/2020   History of Present Illness  Jennifer [name] is a 87 y.o. female with a known history of afib on Xarelto, HTN, HLD, GERD, polio presents to the emergency department for evaluation of nausea, NBNB vomiting and watery diarrhea without abdominal pain for one day. States that she ate seafood yesterday.  She had associated dizziness and lightheadedness but is feeling better now.  She was initially found to be in afib with RVR, was started on a dilt drip in the ER and was able to be titrated off.  Clinical Impression  Patient received in bed, pleasant, agreeable to PT assessment. Patient wears AFO on left LE and post op shoe on right. Patient is able to don AFO independently. She is mod independent with bed mobility, transfers with supervision/min guard. She is able to ambulate 25 feet with RW and min guard to supervision. She will continue to benefit from skilled PT while here to improve strength and functional mobility.      Follow Up Recommendations Home health PT    Equipment Recommendations  None recommended by PT    Recommendations for Other Services       Precautions / Restrictions Precautions Precautions: Fall Restrictions Weight Bearing Restrictions: No      Mobility  Bed Mobility Overal bed mobility: Modified Independent                  Transfers Overall transfer level: Needs assistance Equipment used: Rolling walker (2 wheeled) Transfers: Sit to/from Stand Sit to Stand: Min guard            Ambulation/Gait Ambulation/Gait assistance: Min guard Gait Distance (Feet): 25 Feet Assistive device: Rolling walker (2 wheeled) Gait Pattern/deviations: Step-through pattern;Decreased step length - right;Decreased step length - left;Narrow base of support;Decreased stride length Gait velocity: decr   General Gait Details: Patient ambulates with  slow steady pace. Requires AFO on left LE and post op shoe on right LE. Patient generally steady  Information systems manager Rankin (Stroke Patients Only)       Balance Overall balance assessment: Needs assistance Sitting-balance support: Feet supported Sitting balance-Leahy Scale: Good     Standing balance support: Bilateral upper extremity supported;During functional activity Standing balance-Leahy Scale: Fair Standing balance comment: reliant on RW                             Pertinent Vitals/Pain Pain Assessment: Faces Faces Pain Scale: Hurts a little bit Pain Location: back Pain Descriptors / Indicators: Discomfort Pain Intervention(s): RN gave pain meds during session    Home Living Family/patient expects to be discharged to:: Private residence Living Arrangements: Alone Available Help at Discharge: Personal care attendant;Available 24 hours/day Type of Home: House Home Access: Level entry     Home Layout: One level Home Equipment: Walker - 2 wheels      Prior Function Level of Independence: Independent with assistive device(s)               Hand Dominance        Extremity/Trunk Assessment   Upper Extremity Assessment Upper Extremity Assessment: Overall WFL for tasks assessed    Lower Extremity Assessment Lower Extremity Assessment: Overall WFL for tasks assessed    Cervical / Trunk Assessment Cervical / Trunk Assessment: Normal  Communication  Communication: No difficulties  Cognition Arousal/Alertness: Awake/alert Behavior During Therapy: WFL for tasks assessed/performed Overall Cognitive Status: Within Functional Limits for tasks assessed                                        General Comments      Exercises     Assessment/Plan    PT Assessment Patient needs continued PT services  PT Problem List Decreased strength;Decreased mobility;Decreased activity tolerance        PT Treatment Interventions Therapeutic exercise;Gait training;Balance training;Functional mobility training;Therapeutic activities;Patient/family education    PT Goals (Current goals can be found in the Care Plan section)  Acute Rehab PT Goals Patient Stated Goal: to return home PT Goal Formulation: With patient Time For Goal Achievement: 06/25/20 Potential to Achieve Goals: Good    Frequency Min 2X/week   Barriers to discharge        Co-evaluation               AM-PAC PT "6 Clicks" Mobility  Outcome Measure Help needed turning from your back to your side while in a flat bed without using bedrails?: A Little Help needed moving from lying on your back to sitting on the side of a flat bed without using bedrails?: A Little Help needed moving to and from a bed to a chair (including a wheelchair)?: A Little Help needed standing up from a chair using your arms (e.g., wheelchair or bedside chair)?: A Little Help needed to walk in hospital room?: A Little Help needed climbing 3-5 steps with a railing? : A Little 6 Click Score: 18    End of Session Equipment Utilized During Treatment: Gait belt Activity Tolerance: Patient tolerated treatment well Patient left: in chair;with call bell/phone within reach;with chair alarm set Nurse Communication: Mobility status PT Visit Diagnosis: Muscle weakness (generalized) (M62.81);Difficulty in walking, not elsewhere classified (R26.2)    Time: 9935-7017 PT Time Calculation (min) (ACUTE ONLY): 25 min   Charges:   PT Evaluation $PT Eval Moderate Complexity: 1 Mod PT Treatments $Gait Training: 8-22 mins        Talani Brazee, PT, GCS 06/18/20,11:30 AM

## 2020-06-18 NOTE — Progress Notes (Signed)
PROGRESS NOTE  Jennifer [name] RUE:[id number] DOB: 1931/09/10 DOA: 06/17/2020 PCP: Mickey Farber, MD   LOS: 1 day   Brief Narrative / Interim history: 87 year old female with history of paroxysmal A. fib on Xarelto, HTN, HLD, GERD, prior polio using left foot brace, living alone comes to the hospital with chief complaint of nausea, vomiting and watery diarrhea.  She reports eating seafood yesterday, and started feeling sick a little bit later.  She was also complaining of lightheadedness, dizziness and presented to the ER.  She was found to be in A. fib with RVR and started on diltiazem drip.  She was weaned off overnight because she converted to sinus rhythm however jumped back to A. fib requiring resumption of her diltiazem infusion  Subjective / 24h Interval events: She feels better this morning, no further nausea or vomiting but still feels some discomfort in her stomach.  No fever or chills, no chest pain, no shortness of breath.  No dysuria  Assessment & Plan: Principal Problem Paroxysmal A. fib, with RVR -initially requiring diltiazem infusion, this was discontinued overnight because she was converted to sinus rhythm however went back into A. fib and this morning she is still on diltiazem infusion.  Resume oral diltiazem and attempt to wean off of the infusion later on today.  She is rate controlled on the monitor on my evaluation with a heart rate in the 80s. -Continue Xarelto for anticoagulation  Active Problems Acute gastroenteritis causing dehydration -possibly in the setting of seafood consumption, she is afebrile, no leukocytosis, for now conservative management with IV fluids.  It seems to be better this morning when she longer has diarrhea.  She does not really want to get but have encouraged patient try some liquids first  Essential hypertension-blood pressure stable through all days, continue home oral diltiazem and stop infusion later on today.  Hold lisinopril for now to  have room for adjustment.  Hyperlipidemia-continue statin  History of polio-obtain PT. She lives alone.  Scheduled Meds: . diltiazem  240 mg Oral Daily  . rivaroxaban  20 mg Oral Q supper  . simvastatin  10 mg Oral QHS   Continuous Infusions: . sodium chloride 75 mL/hr at 06/18/20 0713  . diltiazem (CARDIZEM) infusion 5 mg/hr (06/18/20 0339)   PRN Meds:.acetaminophen **OR** acetaminophen, albuterol, bisacodyl, ipratropium, magnesium citrate, morphine injection, ondansetron **OR** ondansetron (ZOFRAN) IV, senna-docusate, traZODone  Diet Orders (From admission, onward)    Start     Ordered   06/17/20 2148  Diet Heart Room service appropriate? Yes; Fluid consistency: Thin  Diet effective now       Comments: Advance as tolerated clears to heart healthy  Question Answer Comment  Room service appropriate? Yes   Fluid consistency: Thin      06/17/20 2147          DVT prophylaxis: SCDs Start: 06/17/20 2148 rivaroxaban (XARELTO) tablet 20 mg     Code Status: Full Code  Family Communication: no family at bedside   Status is: Inpatient  Remains inpatient appropriate because:Inpatient level of care appropriate due to severity of illness   Dispo: The patient is from: Home              Anticipated d/c is to: TBD              Patient currently is not medically stable to d/c.   Difficult to place patient No   Level of care: Progressive Cardiac  Consultants:  none  Procedures:  None  Microbiology  None   Antimicrobials: None     Objective: Vitals:   06/18/20 0630 06/18/20 0700 06/18/20 0714 06/18/20 0855  BP: (!) 128/45 (!) 125/51  (!) 128/50  Pulse: 86 96  89  Resp: (!) 26 (!) 23  18  Temp:    99.6 F (37.6 C)  TempSrc:    Oral  SpO2: 94% 97%  98%  Weight:   59 kg 58.2 kg  Height:   4\' 11"  (1.499 m) 4\' 11"  (1.499 m)    Intake/Output Summary (Last 24 hours) at 06/18/2020 1026 Last data filed at 06/18/2020 0950 Gross per 24 hour  Intake 360 ml  Output --   Net 360 ml   Filed Weights   06/17/20 1932 06/18/20 0714 06/18/20 0855  Weight: 59 kg 59 kg 58.2 kg    Examination:  Constitutional: NAD Eyes: no scleral icterus ENMT: Mucous membranes are moist.  Neck: normal, supple Respiratory: clear to auscultation bilaterally, no wheezing, no crackles. Normal respiratory effort.  Cardiovascular: Irregularly irregular, no murmurs appreciated.  No peripheral edema Abdomen: non distended, mild tenderness throughout but no guarding or rebound.  Bowel sounds positive. Musculoskeletal: no clubbing / cyanosis.  Skin: no rashes Neurologic: CN 2-12 grossly intact. Strength 5/5 in all 4.  Psychiatric: Normal judgment and insight. Alert and oriented x 3. Normal mood.   Data Reviewed: I have independently reviewed following labs and imaging studies   CBC: Recent Labs  Lab 06/17/20 1935 06/18/20 0533  WBC 7.6 6.7  NEUTROABS 6.7  --   HGB 12.9 11.4*  HCT 40.0 34.6*  MCV 95.5 94.5  PLT 233 226   Basic Metabolic Panel: Recent Labs  Lab 06/17/20 1935 06/17/20 1944 06/17/20 2156 06/18/20 0533  NA 139  --   --  136  K 3.8  --   --  3.6  CL 107  --   --  106  CO2 23  --   --  23  GLUCOSE 187*  --   --  165*  BUN 18  --   --  14  CREATININE 0.56  --   --  0.51  CALCIUM 9.3  --   --  8.5*  MG  --  1.7 1.7  --   PHOS  --   --  3.0  --    Liver Function Tests: Recent Labs  Lab 06/17/20 1935 06/18/20 0533  AST 28 21  ALT 26 22  ALKPHOS 94 68  BILITOT 0.9 0.7  PROT 7.3 6.1*  ALBUMIN 4.1 3.2*   Coagulation Profile: No results for input(s): INR, PROTIME in the last 168 hours. HbA1C: No results for input(s): HGBA1C in the last 72 hours. CBG: No results for input(s): GLUCAP in the last 168 hours.  Recent Results (from the past 240 hour(s))  Resp Panel by RT-PCR (Flu A&B, Covid) Nasopharyngeal Swab     Status: None   Collection Time: 06/17/20  8:07 PM   Specimen: Nasopharyngeal Swab; Nasopharyngeal(NP) swabs in vial transport medium   Result Value Ref Range Status   SARS Coronavirus 2 by RT PCR NEGATIVE NEGATIVE Final    Comment: (NOTE) SARS-CoV-2 target nucleic acids are NOT DETECTED.  The SARS-CoV-2 RNA is generally detectable in upper respiratory specimens during the acute phase of infection. The lowest concentration of SARS-CoV-2 viral copies this assay can detect is 138 copies/mL. A negative result does not preclude SARS-Cov-2 infection and should not be used as the sole basis for treatment or other patient management  decisions. A negative result may occur with  improper specimen collection/handling, submission of specimen other than nasopharyngeal swab, presence of viral mutation(s) within the areas targeted by this assay, and inadequate number of viral copies(<138 copies/mL). A negative result must be combined with clinical observations, patient history, and epidemiological information. The expected result is Negative.  Fact Sheet for Patients:  BloggerCourse.com  Fact Sheet for Healthcare Providers:  SeriousBroker.it  This test is no t yet approved or cleared by the [place] FDA and  has been authorized for detection and/or diagnosis of SARS-CoV-2 by FDA under an Emergency Use Authorization (EUA). This EUA will remain  in effect (meaning this test can be used) for the duration of the COVID-19 declaration under Section 564(b)(1) of the Act, 21 U.S.C.section 360bbb-3(b)(1), unless the authorization is terminated  or revoked sooner.       Influenza A by PCR NEGATIVE NEGATIVE Final   Influenza B by PCR NEGATIVE NEGATIVE Final    Comment: (NOTE) The Xpert Xpress SARS-CoV-2/FLU/RSV plus assay is intended as an aid in the diagnosis of influenza from Nasopharyngeal swab specimens and should not be used as a sole basis for treatment. Nasal washings and aspirates are unacceptable for Xpert Xpress SARS-CoV-2/FLU/RSV testing.  Fact Sheet for  Patients: BloggerCourse.com  Fact Sheet for Healthcare Providers: SeriousBroker.it  This test is not yet approved or cleared by the [place] FDA and has been authorized for detection and/or diagnosis of SARS-CoV-2 by FDA under an Emergency Use Authorization (EUA). This EUA will remain in effect (meaning this test can be used) for the duration of the COVID-19 declaration under Section 564(b)(1) of the Act, 21 U.S.C. section 360bbb-3(b)(1), unless the authorization is terminated or revoked.  Performed at [facility], [address], [place], [place] [address]      Radiology Studies: DG Abdomen Acute W/Chest  Result Date: 06/17/2020 CLINICAL DATA:  Fever, nausea, vomiting EXAM: DG ABDOMEN ACUTE WITH 1 VIEW CHEST COMPARISON:  Chest x-ray 04/11/2018 FINDINGS: Heart is normal size. There is hyperinflation of the lungs compatible with COPD. No confluent opacities or effusions. No evidence of bowel obstruction. No free air organomegaly. No suspicious calcification. Multilevel vertebroplasty changes in the lumbar spine. Prior left hip replacement. No acute bony abnormality. IMPRESSION: COPD. No acute cardiopulmonary disease. No acute findings in the abdomen. Electronically Signed   By: Charlett Nose M.D.   On: 06/17/2020 20:35   Pamella Pert, MD, PhD [facility]  Between 7 am - 7 pm I am available, please contact me via Amion (for emergencies) or Securechat (non urgent messages)  Between 7 pm - 7 am I am not available, please contact night coverage MD/APP via Amion

## 2020-06-18 NOTE — ED Notes (Signed)
Attending provider, Tonye Royalty, notified of patient's heart rhythm change from normal sinus to atrial fibrillation with a rate of 100-120 bpm. Patient is resting comfortably with her eyes closed.

## 2020-06-19 DIAGNOSIS — K529 Noninfective gastroenteritis and colitis, unspecified: Principal | ICD-10-CM

## 2020-06-19 DIAGNOSIS — R197 Diarrhea, unspecified: Secondary | ICD-10-CM

## 2020-06-19 DIAGNOSIS — R112 Nausea with vomiting, unspecified: Secondary | ICD-10-CM

## 2020-06-19 DIAGNOSIS — I4891 Unspecified atrial fibrillation: Secondary | ICD-10-CM

## 2020-06-19 DIAGNOSIS — E876 Hypokalemia: Secondary | ICD-10-CM

## 2020-06-19 LAB — MAGNESIUM: Magnesium: 1.5 mg/dL — ABNORMAL LOW (ref 1.7–2.4)

## 2020-06-19 LAB — COMPREHENSIVE METABOLIC PANEL
ALT: 28 U/L (ref 0–44)
AST: 35 U/L (ref 15–41)
Albumin: 2.8 g/dL — ABNORMAL LOW (ref 3.5–5.0)
Alkaline Phosphatase: 65 U/L (ref 38–126)
Anion gap: 8 (ref 5–15)
BUN: 10 mg/dL (ref 8–23)
CO2: 24 mmol/L (ref 22–32)
Calcium: 8.4 mg/dL — ABNORMAL LOW (ref 8.9–10.3)
Chloride: 106 mmol/L (ref 98–111)
Creatinine, Ser: 0.54 mg/dL (ref 0.44–1.00)
GFR, Estimated: >60 mL/min (ref >60)
Glucose, Bld: 119 mg/dL — ABNORMAL HIGH (ref 70–99)
Potassium: 3.4 mmol/L — ABNORMAL LOW (ref 3.5–5.1)
Sodium: 138 mmol/L (ref 135–145)
Total Bilirubin: 0.4 mg/dL (ref 0.3–1.2)
Total Protein: 5.2 g/dL — ABNORMAL LOW (ref 6.5–8.1)

## 2020-06-19 LAB — CBC
HCT: 31.4 % — ABNORMAL LOW (ref 36.0–46.0)
Hemoglobin: 10.2 g/dL — ABNORMAL LOW (ref 12.0–15.0)
MCH: 31.3 pg (ref 26.0–34.0)
MCHC: 32.5 g/dL (ref 30.0–36.0)
MCV: 96.3 fL (ref 80.0–100.0)
Platelets: 187 10*3/uL (ref 150–400)
RBC: 3.26 MIL/uL — ABNORMAL LOW (ref 3.87–5.11)
RDW: 14.4 % (ref 11.5–15.5)
WBC: 3.8 10*3/uL — ABNORMAL LOW (ref 4.0–10.5)
nRBC: 0 % (ref 0.0–0.2)

## 2020-06-19 LAB — PHOSPHORUS: Phosphorus: 2.8 mg/dL (ref 2.5–4.6)

## 2020-06-19 MED ORDER — POTASSIUM CHLORIDE CRYS ER 20 MEQ PO TBCR
40.0000 meq | EXTENDED_RELEASE_TABLET | Freq: Once | ORAL | Status: AC
  Administered 2020-06-19: 40 meq via ORAL
  Filled 2020-06-19: qty 2

## 2020-06-19 MED ORDER — MAGNESIUM SULFATE 4 GM/100ML IV SOLN
4.0000 g | INTRAVENOUS | Status: AC
  Administered 2020-06-19: 4 g via INTRAVENOUS
  Filled 2020-06-19: qty 100

## 2020-06-19 NOTE — TOC Transition Note (Signed)
Transition of Care [facility]) - CM/SW Discharge Note   Patient Details  Name: Jennifer [name] MRN: [id number] Date of Birth: Jun 20, 1931  [facility]) CM/SW Contact:  Maree Krabbe, LCSW Phone Number: 06/19/2020, 9:42 AM   Clinical Narrative:   HH arranged and DME to be delivered at bedside. Pt's daughter will pick pt up at noon. No additional needs.    Final next level of care: Home w Home Health Services Barriers to Discharge: No Barriers Identified   Patient Goals and CMS Choice Patient states their goals for this hospitalization and ongoing recovery are:: to get better   Choice offered to / list presented to : Patient  Discharge Placement                Patient to be transferred to facility by: daughter   Patient and family notified of of transfer: 06/19/20  Discharge Plan and Services     Post Acute Care Choice: Home Health          DME Arranged: Dan Humphreys DME Agency: [facility] Date DME Agency Contacted: 06/19/20 Time DME Agency Contacted: [phone] Representative spoke with at DME Agency: patricia HH Arranged: PT HH Agency: [facility] Date HH Agency Contacted: 06/19/20 Time HH Agency Contacted: [phone] Representative spoke with at [facility] Agency: Barbara Cower  Social Determinants of Health (SDOH) Interventions     Readmission Risk Interventions No flowsheet data found.

## 2020-06-19 NOTE — Progress Notes (Signed)
Pt denies any pain. Denies n/v. Denies diarrhea. VSS. Safety measures maintained. Will continue to monitor.

## 2020-06-19 NOTE — Plan of Care (Signed)

## 2020-06-19 NOTE — TOC Transition Note (Signed)
Transition of Care [facility]) - CM/SW Discharge Note   Patient Details  Name: Jennifer [name] MRN: [id number] Date of Birth: [birth year]  Transition of Care [facility]) CM/SW Contact:  Maree Krabbe, LCSW Phone Number: 06/19/2020, 9:41 AM   Clinical Narrative: Pt is agreeable to home health. Pt has had home health in the past however does not have a agency preference. Referral sent to [facility] agencies--[facility] will service pt. Referral for walker sent to [facility] with [facility]--will be delivered at bedside.      Barriers to Discharge: Continued Medical Work up   Patient Goals and CMS Choice Patient states their goals for this hospitalization and ongoing recovery are:: to get better   Choice offered to / list presented to : Patient  Discharge Placement                       Discharge Plan and Services     Post Acute Care Choice: Home Health          DME Arranged: Dan Humphreys DME Agency: [facility] Date DME Agency Contacted: 06/19/20 Time DME Agency Contacted: [phone] Representative spoke with at DME Agency: patricia HH Arranged: PT HH Agency: [facility] Date HH Agency Contacted: 06/19/20 Time HH Agency Contacted: [phone] Representative spoke with at [facility] Agency: Barbara Cower  Social Determinants of Health (SDOH) Interventions     Readmission Risk Interventions No flowsheet data found.

## 2020-06-19 NOTE — Discharge Summary (Signed)
[facility]   PATIENT NAME: Jennifer [name]    MR#:  [id number]  DATE OF BIRTH:  1931-07-08  DATE OF ADMISSION:  06/17/2020 ADMITTING PHYSICIAN: Tonye Royalty, DO  DATE OF DISCHARGE: 06/19/2020  PRIMARY CARE PHYSICIAN: Mickey Farber, MD    ADMISSION DIAGNOSIS:  Atrial fibrillation with RVR (HCC) [I48.91] Nausea vomiting and diarrhea [R11.2, R19.7]  DISCHARGE DIAGNOSIS:  Afib with RVR with h/o PAF on chronic anticoagulation Acute Gastroenteritis  SECONDARY DIAGNOSIS:   Past Medical History:  Diagnosis Date  . Arthritis   . Atrial fibrillation (HCC)   . GERD (gastroesophageal reflux disease)   . High cholesterol   . Hypertension   . Polio    left leg weakness, wears brace    HOSPITAL COURSE:  87 year old female with history of paroxysmal A. fib on Xarelto, HTN, HLD, GERD, prior polio using left foot brace, living alone comes to the hospital with chief complaint of nausea, vomiting and watery diarrhea.  She reports eating seafood yesterday, and started feeling sick a little bit later.  She was also complaining of lightheadedness, dizziness and presented to the ER.  She was found to be in A. fib with RVR and started on diltiazem drip  Paroxysmal A. fib, with RVR -initially requiring diltiazem infusion, this was discontinued overnight because she was converted to sinus rhythm however went back into A. Fib. She is rate controlled on the monitor on my evaluation with a heart rate in the 60-70's. -Continue Xarelto for anticoagulation --bp stable  Acute gastroenteritis causing dehydration -possibly in the setting of seafood consumption, she is afebrile, no leukocytosis, for now conservative management with IV fluids.   --pt tolerating po diet and diarrhea improving. --replace magnesium and K today  Essential hypertension-blood pressure stable through all days, continue home oral diltiazem  --resumed lisinopril at discharge with holding  parameter given to pt  Hyperlipidemia-continue statin  History of polio- PT recommends HHPT. She lives alone and has caregiver come help her HHPT arranged RW to be provided  D/w DIL teresa over the phone Will d/c later today  CONSULTS OBTAINED:    DRUG ALLERGIES:   Allergies  Allergen Reactions  . Esomeprazole Magnesium Diarrhea  . Nitrofurantoin     "Flu"    DISCHARGE MEDICATIONS:   Allergies as of 06/19/2020      Reactions   Esomeprazole Magnesium Diarrhea   Nitrofurantoin    "Flu"      Medication List    TAKE these medications   acetaminophen 500 MG tablet Commonly known as: TYLENOL Take 500-1,000 mg by mouth 2 (two) times daily.   B-12 PO Take 1 tablet by mouth daily.   diltiazem 240 MG 24 hr capsule Commonly known as: CARDIZEM CD Take 240 mg by mouth daily.   lisinopril 40 MG tablet Commonly known as: ZESTRIL Take 40 mg by mouth daily. Notes to patient: Please hold this if your BP <120   omeprazole 20 MG capsule Commonly known as: PRILOSEC Take 20 mg by mouth 2 (two) times daily.   rivaroxaban 20 MG Tabs tablet Commonly known as: XARELTO Take 1 tablet (20 mg total) by mouth daily with supper.   simvastatin 10 MG tablet Commonly known as: ZOCOR Take 10 mg by mouth at bedtime.   Vitamin D (Ergocalciferol) 1.25 MG (50000 UNIT) Caps capsule Commonly known as: DRISDOL Take 1 capsule by mouth every 14 (fourteen) days.            Durable Medical Equipment  (  From admission, onward)         Start     Ordered   06/19/20 0931  For home use only DME Walker rolling  Once       Question Answer Comment  Walker: With 5 Inch Wheels   Patient needs a walker to treat with the following condition General weakness      06/19/20 0930          If you experience worsening of your admission symptoms, develop shortness of breath, life threatening emergency, suicidal or homicidal thoughts you must seek medical attention immediately by calling 911 or  calling your MD immediately  if symptoms less severe.  You Must read complete instructions/literature along with all the possible adverse reactions/side effects for all the Medicines you take and that have been prescribed to you. Take any new Medicines after you have completely understood and accept all the possible adverse reactions/side effects.   Please note  You were cared for by a hospitalist during your hospital stay. If you have any questions about your discharge medications or the care you received while you were in the hospital after you are discharged, you can call the unit and asked to speak with the hospitalist on call if the hospitalist that took care of you is not available. Once you are discharged, your primary care physician will handle any further medical issues. Please note that NO REFILLS for any discharge medications will be authorized once you are discharged, as it is imperative that you return to your primary care physician (or establish a relationship with a primary care physician if you do not have one) for your aftercare needs so that they can reassess your need for medications and monitor your lab values. Today   SUBJECTIVE   Doing  Well. Ate BF this am Out of bed to chair  VITAL SIGNS:  Blood pressure (!) 128/53, pulse 78, temperature 98 F (36.7 C), resp. rate 17, height 4\' 11"  (1.499 m), weight 58.2 kg, SpO2 94 %.  I/O:    Intake/Output Summary (Last 24 hours) at 06/19/2020 0954 Last data filed at 06/19/2020 0926 Gross per 24 hour  Intake 3035.77 ml  Output --  Net 3035.77 ml    PHYSICAL EXAMINATION:  GENERAL:  87 y.o.-year-old patient lying in the bed with no acute distress.  LUNGS: Normal breath sounds bilaterally, no wheezing, rales,rhonchi or crepitation. No use of accessory muscles of respiration.  CARDIOVASCULAR: S1, S2 normal. No murmurs, rubs, or gallops.  ABDOMEN: Soft, non-tender, non-distended. Bowel sounds present. No organomegaly or mass.   EXTREMITIES: No pedal edema, cyanosis, or clubbing. Right foot wedge boot+ NEUROLOGIC: Cranial nerves II through XII are intact. Muscle strength 5/5 in all extremities. Sensation intact. Gait not checked.  PSYCHIATRIC: The patient is alert and oriented x 3.  SKIN: No obvious rash, lesion, or ulcer.   DATA REVIEW:   CBC  Recent Labs  Lab 06/19/20 0628  WBC 3.8*  HGB 10.2*  HCT 31.4*  PLT 187    Chemistries  Recent Labs  Lab 06/19/20 0628  NA 138  K 3.4*  CL 106  CO2 24  GLUCOSE 119*  BUN 10  CREATININE 0.54  CALCIUM 8.4*  MG 1.5*  AST 35  ALT 28  ALKPHOS 65  BILITOT 0.4    Microbiology Results   Recent Results (from the past 240 hour(s))  Resp Panel by RT-PCR (Flu A&B, Covid) Nasopharyngeal Swab     Status: None   Collection Time: 06/17/20  8:07 PM   Specimen: Nasopharyngeal Swab; Nasopharyngeal(NP) swabs in vial transport medium  Result Value Ref Range Status   SARS Coronavirus 2 by RT PCR NEGATIVE NEGATIVE Final    Comment: (NOTE) SARS-CoV-2 target nucleic acids are NOT DETECTED.  The SARS-CoV-2 RNA is generally detectable in upper respiratory specimens during the acute phase of infection. The lowest concentration of SARS-CoV-2 viral copies this assay can detect is 138 copies/mL. A negative result does not preclude SARS-Cov-2 infection and should not be used as the sole basis for treatment or other patient management decisions. A negative result may occur with  improper specimen collection/handling, submission of specimen other than nasopharyngeal swab, presence of viral mutation(s) within the areas targeted by this assay, and inadequate number of viral copies(<138 copies/mL). A negative result must be combined with clinical observations, patient history, and epidemiological information. The expected result is Negative.  Fact Sheet for Patients:  BloggerCourse.com  Fact Sheet for Healthcare Providers:   SeriousBroker.it  This test is no t yet approved or cleared by the [place] FDA and  has been authorized for detection and/or diagnosis of SARS-CoV-2 by FDA under an Emergency Use Authorization (EUA). This EUA will remain  in effect (meaning this test can be used) for the duration of the COVID-19 declaration under Section 564(b)(1) of the Act, 21 U.S.C.section 360bbb-3(b)(1), unless the authorization is terminated  or revoked sooner.       Influenza A by PCR NEGATIVE NEGATIVE Final   Influenza B by PCR NEGATIVE NEGATIVE Final    Comment: (NOTE) The Xpert Xpress SARS-CoV-2/FLU/RSV plus assay is intended as an aid in the diagnosis of influenza from Nasopharyngeal swab specimens and should not be used as a sole basis for treatment. Nasal washings and aspirates are unacceptable for Xpert Xpress SARS-CoV-2/FLU/RSV testing.  Fact Sheet for Patients: BloggerCourse.com  Fact Sheet for Healthcare Providers: SeriousBroker.it  This test is not yet approved or cleared by the [place] FDA and has been authorized for detection and/or diagnosis of SARS-CoV-2 by FDA under an Emergency Use Authorization (EUA). This EUA will remain in effect (meaning this test can be used) for the duration of the COVID-19 declaration under Section 564(b)(1) of the Act, 21 U.S.C. section 360bbb-3(b)(1), unless the authorization is terminated or revoked.  Performed at [facility], [address] [place], [place] [address]     RADIOLOGY:  [place] Abdomen Acute W/Chest  Result Date: 06/17/2020 CLINICAL DATA:  Fever, nausea, vomiting EXAM: DG ABDOMEN ACUTE WITH 1 VIEW CHEST COMPARISON:  Chest x-ray 04/11/2018 FINDINGS: Heart is normal size. There is hyperinflation of the lungs compatible with COPD. No confluent opacities or effusions. No evidence of bowel obstruction. No free air organomegaly. No suspicious calcification.  Multilevel vertebroplasty changes in the lumbar spine. Prior left hip replacement. No acute bony abnormality. IMPRESSION: COPD. No acute cardiopulmonary disease. No acute findings in the abdomen. Electronically Signed   By: Charlett Nose M.D.   On: 06/17/2020 20:35     CODE STATUS:     Code Status Orders  (From admission, onward)         Start     Ordered   06/17/20 2148  Full code  Continuous        06/17/20 2147        Code Status History    Date Active Date Inactive Code Status Order ID Comments User Context   03/01/2016 1859 03/02/2016 1929 Full Code [id number]  Shaune Pollack, MD Inpatient   Advance Care Planning  Activity    Advance Directive Documentation   Flowsheet Row Most Recent Value  Type of Advance Directive Healthcare Power of Attorney, Living will  Pre-existing out of facility DNR order (yellow form or pink MOST form) --  "MOST" Form in Place? --       TOTAL TIME TAKING CARE OF THIS PATIENT: 40 minutes.    Enedina Finner M.D  [facility]    CC: Primary care physician; Mickey Farber, MD

## 2020-06-19 NOTE — Discharge Instructions (Signed)

## 2021-03-30 ENCOUNTER — Emergency Department: Payer: Medicare Other

## 2021-03-30 ENCOUNTER — Other Ambulatory Visit: Payer: Self-pay

## 2021-03-30 DIAGNOSIS — S43015A Anterior dislocation of left humerus, initial encounter: Secondary | ICD-10-CM | POA: Insufficient documentation

## 2021-03-30 DIAGNOSIS — W1839XA Other fall on same level, initial encounter: Secondary | ICD-10-CM | POA: Insufficient documentation

## 2021-03-30 DIAGNOSIS — I10 Essential (primary) hypertension: Secondary | ICD-10-CM | POA: Insufficient documentation

## 2021-03-30 DIAGNOSIS — Z7901 Long term (current) use of anticoagulants: Secondary | ICD-10-CM | POA: Insufficient documentation

## 2021-03-30 DIAGNOSIS — S0990XA Unspecified injury of head, initial encounter: Secondary | ICD-10-CM | POA: Diagnosis not present

## 2021-03-30 DIAGNOSIS — Y92009 Unspecified place in unspecified non-institutional (private) residence as the place of occurrence of the external cause: Secondary | ICD-10-CM | POA: Insufficient documentation

## 2021-03-30 DIAGNOSIS — S4992XA Unspecified injury of left shoulder and upper arm, initial encounter: Secondary | ICD-10-CM | POA: Diagnosis present

## 2021-03-30 LAB — CBC WITH DIFFERENTIAL/PLATELET
Abs Immature Granulocytes: 0.02 10*3/uL (ref 0.00–0.07)
Basophils Absolute: 0 10*3/uL (ref 0.0–0.1)
Basophils Relative: 1 %
Eosinophils Absolute: 0.1 10*3/uL (ref 0.0–0.5)
Eosinophils Relative: 1 %
HCT: 35.9 % — ABNORMAL LOW (ref 36.0–46.0)
Hemoglobin: 11.6 g/dL — ABNORMAL LOW (ref 12.0–15.0)
Immature Granulocytes: 0 %
Lymphocytes Relative: 34 %
Lymphs Abs: 2.6 10*3/uL (ref 0.7–4.0)
MCH: 30.3 pg (ref 26.0–34.0)
MCHC: 32.3 g/dL (ref 30.0–36.0)
MCV: 93.7 fL (ref 80.0–100.0)
Monocytes Absolute: 0.6 10*3/uL (ref 0.1–1.0)
Monocytes Relative: 8 %
Neutro Abs: 4.4 10*3/uL (ref 1.7–7.7)
Neutrophils Relative %: 56 %
Platelets: 288 10*3/uL (ref 150–400)
RBC: 3.83 MIL/uL — ABNORMAL LOW (ref 3.87–5.11)
RDW: 13.5 % (ref 11.5–15.5)
WBC: 7.7 10*3/uL (ref 4.0–10.5)
nRBC: 0 % (ref 0.0–0.2)

## 2021-03-30 LAB — COMPREHENSIVE METABOLIC PANEL
ALT: 15 U/L (ref 0–44)
AST: 21 U/L (ref 15–41)
Albumin: 3.8 g/dL (ref 3.5–5.0)
Alkaline Phosphatase: 125 U/L (ref 38–126)
Anion gap: 9 (ref 5–15)
BUN: 28 mg/dL — ABNORMAL HIGH (ref 8–23)
CO2: 23 mmol/L (ref 22–32)
Calcium: 9.5 mg/dL (ref 8.9–10.3)
Chloride: 103 mmol/L (ref 98–111)
Creatinine, Ser: 0.74 mg/dL (ref 0.44–1.00)
GFR, Estimated: >60 mL/min (ref >60)
Glucose, Bld: 263 mg/dL — ABNORMAL HIGH (ref 70–99)
Potassium: 3.5 mmol/L (ref 3.5–5.1)
Sodium: 135 mmol/L (ref 135–145)
Total Bilirubin: 0.8 mg/dL (ref 0.3–1.2)
Total Protein: 7 g/dL (ref 6.5–8.1)

## 2021-03-30 NOTE — ED Triage Notes (Signed)
Pt BIB via EMS for a fall that happened this evening. Pt c/o left shoulder pain. Pt received of fentanyl enroute.

## 2021-03-30 NOTE — ED Provider Triage Note (Signed)
Emergency Medicine Provider Triage Evaluation Note  Zitlaly Khali Starkovich , a 88 y.o. female  was evaluated in triage.  Pt complains of left upper arm pain after a mechanical fall tonight.  Patient was attempting to ambulate unassisted from her walker.  Patient unsure if she hit her head.  She is on Xarelto.  Review of Systems  Positive: Patient has left arm and shoulder pain.  Negative: No chest pain or abdominal pain.   Physical Exam  There were no vitals taken for this visit. Gen:   Awake, no distress   Resp:  Normal effort  MSK:   Patient unable to perform full range of motion at the left shoulder.  Other:    Medical Decision Making  Medically screening exam initiated at 9:44 PM.  Appropriate orders placed.  Noriah Kingsmore Mulvaney was informed that the remainder of the evaluation will be completed by another provider, this initial triage assessment does not replace that evaluation, and the importance of remaining in the ED until their evaluation is complete.     Vallarie Mare [place], [place] 03/30/21 2146

## 2021-03-31 ENCOUNTER — Emergency Department: Payer: Medicare Other

## 2021-03-31 ENCOUNTER — Emergency Department
Admission: EM | Admit: 2021-03-31 | Discharge: 2021-03-31 | Disposition: A | Discharge status: Home / Self Care | Payer: Medicare Other | Attending: Emergency Medicine | Admitting: Emergency Medicine

## 2021-03-31 DIAGNOSIS — S43015A Anterior dislocation of left humerus, initial encounter: Secondary | ICD-10-CM

## 2021-03-31 DIAGNOSIS — W19XXXA Unspecified fall, initial encounter: Secondary | ICD-10-CM

## 2021-03-31 MED ORDER — ACETAMINOPHEN 325 MG PO TABS
650.0000 mg | ORAL_TABLET | Freq: Once | ORAL | Status: AC
  Administered 2021-03-31: 650 mg via ORAL
  Filled 2021-03-31: qty 2

## 2021-03-31 MED ORDER — FENTANYL CITRATE PF 50 MCG/ML IJ SOSY
75.0000 ug | PREFILLED_SYRINGE | Freq: Once | INTRAMUSCULAR | Status: AC
  Administered 2021-03-31: 75 ug via INTRAVENOUS
  Filled 2021-03-31: qty 2

## 2021-03-31 NOTE — ED Notes (Signed)
Patient declined sling.  Ambulated in hallway with walker without difficulty. Denied dizziness or lightheadedness during walk. Ambulated back to bed, denies needs at this time.

## 2021-03-31 NOTE — ED Provider Notes (Signed)
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 fib on Xarelto, HTN, HLD, GERD, prior polio using left foot brace and walker at baseline who presents for assessment after a fall that occurred at home.  Patient states she had her left shoulder when she got up off the couch she was sitting on to go to her kitchen.  She states she thinks it may have scraped her knees but has no pain in her knees only in her left shoulder.  She does not think she hit her head or had any LOC.  She denies any acute back pain, chest pain, Donnell pain or any other extremity pain including in the left elbow or wrist.  No recent fevers, chills, cough, vomiting, diarrhea, rash, burning with urination or any other new sick symptoms lately.  No medication changes.  She was not feeling lightheaded or dizzy before this happened and states she lost her balance because it seems she had not had her walker with her when she immediately got up off the couch.      Physical Exam  Triage Vital Signs: ED Triage Vitals  Enc Vitals Group     BP 03/30/21 2146 (!) 175/62     Pulse Rate 03/30/21 2146 (!) 59     Resp 03/30/21 2146 18     Temp 03/30/21 2146 98 F (36.7 C)     Temp Source 03/30/21 2146 Oral     SpO2 03/30/21 2146 98 %     Weight --      Height --      Head Circumference --      Peak Flow --      Pain Score 03/30/21 2142 10     Pain Loc --      Pain Edu? --      Excl. in GC? --     Most recent vital signs: Vitals:   03/31/21 0230 03/31/21 0300  BP: (!) 145/64 (!) 147/66  Pulse: (!) 102 (!) 109  Resp: (!) 24 (!) 27  Temp:    SpO2: 95% 97%    General: Awake, appears mildly uncomfortable. CV:  Good peripheral perfusion.  2+ radial pulses.  Slight systolic murmur. Resp:  Normal effort.  Clear  bilaterally Abd:  No distention.  Soft throughout. Other:  Deformity of the left shoulder without large effusion.  Patient is able to fully range at the left elbow has symmetric grip strength left hand compared to right hand.  Sensation is intact in the distribution of the radial ulnar and median nerves in the left hand.  She has sensation also intact in the left shoulder over the axillary nerve.  2+ radial pulses.  No tenderness step-offs or deformities over the C/T/L-spine.  No other visible trauma to the extremities back or torso.  She has some baseline weakness noted in her left lower extremity and in the right foot.  No new weakness per patient and she confirms this is all baseline.  Sensation is intact light touch throughout.   ED Results / Procedures / Treatments  Labs (all labs ordered are listed, but only abnormal results are displayed) Labs Reviewed  CBC WITH DIFFERENTIAL/PLATELET - Abnormal; Notable for the following components:      Result Value  RBC 3.83 (*)    Hemoglobin 11.6 (*)    HCT 35.9 (*)    All other components within normal limits  COMPREHENSIVE METABOLIC PANEL - Abnormal; Notable for the following components:   Glucose, Bld 263 (*)    BUN 28 (*)    All other components within normal limits  URINALYSIS, COMPLETE (UACMP) WITH MICROSCOPIC     EKG  EKG remarkable for sinus rhythm with a ventricular rate of 80, normal axis, unremarkable intervals without evidence of acute ischemia or significant arrhythmia.   RADIOLOGY  CT head and C-spine reviewed by myself shows no acute skull fracture, intracranial hemorrhage, mass-effect, edema, ischemia or acute C-spine injury.  There is some degenerative changes noted along the C-spine.  As reviewed radiologist interpretation and agree with their findings.  Plain films of the left shoulder and left arm reviewed by myself remarkable for anterior left shoulder dislocation without clear associated fracture.  Also reviewed  radiologist interpretation and agree with their findings.   PROCEDURES:  Critical Care performed: No  Reduction of dislocation  Date/Time: 03/31/2021 2:07 AM Performed by: Gilles Chiquito, MD Authorized by: Gilles Chiquito, MD  Consent: Verbal consent obtained. Consent given by: patient Patient understanding: patient states understanding of the procedure being performed Patient identity confirmed: verbally with patient Local anesthesia used: no  Anesthesia: Local anesthesia used: no  Sedation: Patient sedated: no  Patient tolerance: patient tolerated the procedure well with no immediate complications   .1-3 Lead EKG Interpretation Performed by: Gilles Chiquito, MD Authorized by: Gilles Chiquito, MD     Interpretation: normal     ECG rate assessment: normal     Rhythm: sinus rhythm     Ectopy: none     Conduction: normal      MEDICATIONS ORDERED IN ED: Medications  fentaNYL (SUBLIMAZE) injection 75 mcg (75 mcg Intravenous Given 03/31/21 0149)  acetaminophen (TYLENOL) tablet 650 mg (650 mg Oral Given 03/31/21 0239)     IMPRESSION / MDM / ASSESSMENT AND PLAN / ED COURSE  I reviewed the triage vital signs and the nursing notes.                              Differential diagnosis includes, but is not limited to possible contusion versus fracture and/or dislocation at the left shoulder.  She is denying any other clear associated sick symptoms or pain has no other obvious evidence of significant trauma on exam.  Low suspicion at this time for syncope or seizure precipitating this I think is likely mechanical fall.  Given patient's age and the fact she is anticoagulated and had a fairly hard impact at the shoulder think it is reasonable for her to get a CT head and C-spine  EKG remarkable for sinus rhythm with a ventricular rate of 80, normal axis, unremarkable intervals without evidence of acute ischemia or significant arrhythmia.  CT head and C-spine reviewed by  myself shows no acute skull fracture, intracranial hemorrhage, mass-effect, edema, ischemia or acute C-spine injury.  There is some degenerative changes noted along the C-spine.  As reviewed radiologist interpretation and agree with their findings.  Plain films of the left shoulder and left arm reviewed by myself remarkable for anterior left shoulder dislocation without clear associated fracture.  Also reviewed radiologist interpretation and agree with their findings.  CMP remarkable for glucose of 263 without other significant electrolyte or metabolic derangements.  CBC without leukocytosis and hemoglobin  of 11.6 compared to 10.29 months ago.  Normal platelets.  Patient reduced after receiving little bit of fentanyl.  Postreduction x-ray reviewed by myself shows successful reduction.  As reviewed radiology interpretation and they agree with this.  Patient was able to ambulate without difficulty using walker which she is at baseline following.  She declined a sling.  She will follow-up with her PCP.  She is otherwise neurovascular intact on repeat check of left upper extremity after reduction.  Discharged in stable condition.  Strict return precautions advised and discussed.      FINAL CLINICAL IMPRESSION(S) / ED DIAGNOSES   Final diagnoses:  Anterior dislocation of left shoulder, initial encounter  Fall, initial encounter     Rx / DC Orders   ED Discharge Orders     None        Note:  This document was prepared using Dragon voice recognition software and may include unintentional dictation errors.   Gilles Chiquito, MD 03/31/21 [phone]

## 2021-04-07 ENCOUNTER — Other Ambulatory Visit: Payer: Self-pay | Admitting: Orthopedic Surgery

## 2021-04-07 DIAGNOSIS — S43015A Anterior dislocation of left humerus, initial encounter: Secondary | ICD-10-CM

## 2021-04-11 ENCOUNTER — Ambulatory Visit
Admission: RE | Admit: 2021-04-11 | Discharge: 2021-04-11 | Disposition: A | Discharge status: Home / Self Care | Payer: Medicare Other | Source: Ambulatory Visit | Attending: Orthopedic Surgery | Admitting: Orthopedic Surgery

## 2021-04-11 DIAGNOSIS — S43015A Anterior dislocation of left humerus, initial encounter: Secondary | ICD-10-CM

## 2021-04-17 ENCOUNTER — Other Ambulatory Visit: Payer: Self-pay

## 2021-04-17 ENCOUNTER — Other Ambulatory Visit: Payer: Self-pay | Admitting: Orthopedic Surgery

## 2021-04-17 ENCOUNTER — Ambulatory Visit
Admission: RE | Admit: 2021-04-17 | Discharge: 2021-04-17 | Disposition: A | Discharge status: Home / Self Care | Payer: Medicare Other | Source: Ambulatory Visit | Attending: Orthopedic Surgery | Admitting: Orthopedic Surgery

## 2021-04-17 DIAGNOSIS — M25312 Other instability, left shoulder: Secondary | ICD-10-CM

## 2021-04-21 ENCOUNTER — Other Ambulatory Visit: Payer: Self-pay | Admitting: Orthopedic Surgery

## 2021-04-30 ENCOUNTER — Encounter
Admission: RE | Admit: 2021-04-30 | Discharge: 2021-04-30 | Disposition: A | Discharge status: Home / Self Care | Payer: Medicare Other | Source: Ambulatory Visit | Attending: Orthopedic Surgery | Admitting: Orthopedic Surgery

## 2021-04-30 ENCOUNTER — Other Ambulatory Visit: Payer: Self-pay

## 2021-04-30 ENCOUNTER — Encounter: Payer: Self-pay | Admitting: Orthopedic Surgery

## 2021-04-30 VITALS — BP 134/68 | HR 67 | Resp 16 | Ht 59.0 in | Wt 126.3 lb

## 2021-04-30 DIAGNOSIS — Z01812 Encounter for preprocedural laboratory examination: Secondary | ICD-10-CM | POA: Insufficient documentation

## 2021-04-30 DIAGNOSIS — Z01818 Encounter for other preprocedural examination: Secondary | ICD-10-CM

## 2021-04-30 HISTORY — DX: Personal history of urinary calculi: Z87.442

## 2021-04-30 HISTORY — DX: Other complications of anesthesia, initial encounter: T88.59XA

## 2021-04-30 LAB — URINALYSIS, ROUTINE W REFLEX MICROSCOPIC
Bilirubin Urine: NEGATIVE
Glucose, UA: NEGATIVE mg/dL
Hgb urine dipstick: NEGATIVE
Ketones, ur: NEGATIVE mg/dL
Leukocytes,Ua: NEGATIVE
Nitrite: NEGATIVE
Protein, ur: NEGATIVE mg/dL
Specific Gravity, Urine: 1.018 (ref 1.005–1.030)
pH: 8 (ref 5.0–8.0)

## 2021-04-30 LAB — TYPE AND SCREEN
ABO/RH(D): O POS
Antibody Screen: NEGATIVE

## 2021-04-30 LAB — SURGICAL PCR SCREEN
MRSA, PCR: NEGATIVE
Staphylococcus aureus: NEGATIVE

## 2021-04-30 NOTE — Patient Instructions (Addendum)
Your procedure is scheduled on:05-05-21 Monday Report to the Registration Desk on the 1st floor of the Medical Mall.Then proceed to the 2nd floor Surgery Desk in the Medical [facility] To find out your arrival time, please call [phone] between 1PM - 3PM on:05-02-21 Friday  REMEMBER: Instructions that are not followed completely may result in serious medical risk, up to and including death; or upon the discretion of your surgeon and anesthesiologist your surgery may need to be rescheduled.  Do not eat food after midnight the night before surgery.  No gum chewing, lozengers or hard candies.  You may however, drink Water up to 2 hours before you are scheduled to arrive for your surgery. Do not drink anything within 2 hours of your scheduled arrival time.  TAKE THESE MEDICATIONS THE MORNING OF SURGERY WITH A SIP OF WATER: -diltiazem (CARDIZEM CD) -omeprazole (PRILOSEC) -take one the night before and one on the morning of surgery - helps to prevent nausea after surgery.)  Stop rivaroxaban (XARELTO) 3 days prior to surgery-Last dose on 05-01-21 Thursday-You may restart your rivaroxaban Carlena Hurl) on 05-06-21 per Dr Gwen Pounds  One week prior to surgery: Stop Anti-inflammatories (NSAIDS) such as Advil, Aleve, Ibuprofen, Motrin, Naproxen, Naprosyn and Aspirin based products such as Excedrin, Goodys Powder, BC Powder.You may however, continue to take Tylenol if needed for pain up until the day of surgery.  Stop ANY OVER THE COUNTER supplements/vitamins NOW (04-30-21) until after surgery.  No Alcohol for 24 hours before or after surgery.  No Smoking including e-cigarettes for 24 hours prior to surgery.  No chewable tobacco products for at least 6 hours prior to surgery.  No nicotine patches on the day of surgery.  Do not use any "recreational" drugs for at least a week prior to your surgery.  Please be advised that the combination of cocaine and anesthesia may have negative outcomes, up to and including  death. If you test positive for cocaine, your surgery will be cancelled.  On the morning of surgery brush your teeth with toothpaste and water, you may rinse your mouth with mouthwash if you wish. Do not swallow any toothpaste or mouthwash.  Use CHG Soap as directed on instruction sheet.  Total Shoulder Arthroplasty:  use Benzolyl Peroxide 5% Gel as directed on instruction sheet.  Do not wear jewelry, make-up, hairpins, clips or nail polish.  Do not wear lotions, powders, or perfumes.   Do not shave body from the neck down 48 hours prior to surgery just in case you cut yourself which could leave a site for infection.  Also, freshly shaved skin may become irritated if using the CHG soap.  Contact lenses, hearing aids and dentures may not be worn into surgery.  Do not bring valuables to the hospital. [facility] is not responsible for any missing/lost belongings or valuables.   Notify your doctor if there is any change in your medical condition (cold, fever, infection).  Wear comfortable clothing (specific to your surgery type) to the hospital.  After surgery, you can help prevent lung complications by doing breathing exercises.  Take deep breaths and cough every 1-2 hours. Your doctor may order a device called an Incentive Spirometer to help you take deep breaths. When coughing or sneezing, hold a pillow firmly against your incision with both hands. This is called splinting. Doing this helps protect your incision. It also decreases belly discomfort.  If you are being admitted to the hospital overnight, leave your suitcase in the car. After surgery it may  be brought to your room.  If you are being discharged the day of surgery, you will not be allowed to drive home. You will need a responsible adult (18 years or older) to drive you home and stay with you that night.   If you are taking public transportation, you will need to have a responsible adult (18 years or older) with  you. Please confirm with your physician that it is acceptable to use public transportation.   Please call the Pre-admissions Testing Dept. at [phone] if you have any questions about these instructions.  Surgery Visitation Policy:  Patients undergoing a surgery or procedure may have one family member or support person with them as long as that person is not COVID-19 positive or experiencing its symptoms.  That person may remain in the waiting area during the procedure and may rotate out with other people.  Inpatient Visitation:    Visiting hours are 7 a.m. to 8 p.m. Up to two visitors ages 16+ are allowed at one time in a patient room. The visitors may rotate out with other people during the day. Visitors must check out when they leave, or other visitors will not be allowed. One designated support person may remain overnight. The visitor must pass COVID-19 screenings, use hand sanitizer when entering and exiting the patients room and wear a mask at all times, including in the patients room. Patients must also wear a mask when staff or their visitor are in the room. Masking is required regardless of vaccination status.

## 2021-05-01 ENCOUNTER — Other Ambulatory Visit: Payer: Medicare Other

## 2021-05-01 ENCOUNTER — Encounter: Payer: Self-pay | Admitting: Orthopedic Surgery

## 2021-05-01 NOTE — Progress Notes (Signed)
Perioperative Services  Pre-Admission/Anesthesia Testing Clinical Review  Date: 05/01/21  Patient Demographics:  Name: Jennifer [name] DOB:   1931-09-25 MRN:   [id number]  Planned Surgical Procedure(s):    Case: [id number] Date/Time: 05/05/21 1134   Procedure: Left reverse shoulder arthroplasty, biceps tenodesis (Left: Shoulder)   Anesthesia type: Choice   Pre-op diagnosis:      Rotator cuff insufficiency of left shoulder  M25.312     Anterior dislocation of left shoulder  S43.015A   Location: [facility] OR ROOM 08 / [place] ORS FOR ANESTHESIA GROUP   Surgeons: Leim Fabry, MD   NOTE: Available PAT nursing documentation and vital signs have been reviewed. Clinical nursing staff has updated patient's PMH/PSHx, current medication list, and drug allergies/intolerances to ensure comprehensive history available to assist in medical decision making as it pertains to the aforementioned surgical procedure and anticipated anesthetic course. Extensive review of available clinical information performed. [facility] PMH and PSHx updated with any diagnoses/procedures that  may have been inadvertently omitted during her intake with the pre-admission testing department's nursing staff.  Clinical Discussion:  Jennifer [name] is a 88 y.o. female who is submitted for pre-surgical anesthesia review and clearance prior to her undergoing the above procedure. Patient has never been a smoker. Pertinent PMH includes: atrial fibrillation, diastolic dysfunction, TIA, HTN, HLD, T2DM, COPD, GERD (on daily PPI), post poliomyelitis muscular atrophy, OA.  Patient is followed by cardiology Nehemiah Massed, MD). She was last seen in the cardiology clinic on 08/28/2020; notes reviewed.  At the time of her clinic visit, patient doing well overall from a cardiovascular perspective.  She denied any episodes of chest pain, shortness of breath, PND, orthopnea, significant peripheral edema, vertiginous symptoms, or presyncope/syncope.   Patient with intermittent episodes of palpitations that she describes as being rare and short-lived.  PMH significant for cardiovascular diagnoses.  TTE performed on 03/02/2016 revealed normal left ventricular systolic function with an EF of 65%.  There were no regional wall motion abnormalities.  Diastolic Doppler parameters consistent with abnormal relaxation (G1DD).  There was trivial mitral valve regurgitation.  There was no evidence of a transvalvular gradient to suggest valvular stenosis.  Repeat TTE performed on 11/20/2020 remain grossly unchanged.  PMH significant for an atrial fibrillation diagnosis; CHA2DS2-VASc Score = 7 (age x 2, sex, HTN, TIA x 2, T2DM).  Rate and rhythm maintained on oral diltiazem.  Patient is chronically anticoagulated using daily rivaroxaban; compliant with therapy with no evidence or reports of GI bleeding.  Blood pressure well controlled at 110/60 on currently prescribed CCB and ACEi therapies.  Patient is on a statin for her HLD.  T2DM well-controlled on currently prescribed regimen; last HgbA1c was 6.9% when checked on 10/04/2020.  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  At baseline, patient felt to be able to achieve at least 4 METS of activity without any angina/anginal equivalent symptoms.  No changes were made to her medication regimen.  Patient to follow-up with outpatient cardiology in 1 year or sooner if needed.  Jennifer [name] is scheduled for an elective LEFT REVERSE SHOULDER ARTHROPLASTY AND BICEPS TENODESIS on 05/05/2021 with Dr. Leim Fabry, MD. Given patient's past medical history significant for cardiovascular diagnoses, presurgical cardiac clearance was sought by the PAT team. Per cardiology, "this patient is optimized for surgery and may proceed with the planned procedural course with a LOW risk of significant perioperative cardiovascular  complications".  Again, this patient is on daily anticoagulation therapy.  She has been instructed on recommendations for holding her rivaroxaban for 3 days prior to her procedure with plans to restart as soon as postoperative bleeding risk felt to be minimized by her primary attending surgeon.  The patient is aware that her last dose of apixaban will be on 05/01/2021.  Patient reports previous perioperative complications with anesthesia in the past.  Patient reports a history of (+) delayed emergence following a past kyphoplasty.  In review of the available records, it is noted that patient underwent a MAC anesthetic course at [facility] in 05/2018 without documented complications.   Vitals with BMI 04/30/2021 03/31/2021 03/31/2021  Height _0  - -  Weight 126 lbs 5 oz - -  BMI 40.9 - -  Systolic 811 914 782  Diastolic 68 66 64  Pulse 67 109 102    Providers/Specialists:   NOTE: Primary physician provider listed below. Patient may have been seen by APP or partner within same practice.   PROVIDER ROLE / SPECIALTY LAST Rossie Muskrat, MD Orthopedics (Surgeon) 04/17/2021  Ezequiel Kayser, MD (Inactive) Primary Care Provider 04/04/2021  Serafina Royals, MD Cardiology 08/28/2020   Allergies:  Esomeprazole magnesium and Nitrofurantoin  Current Home Medications:   No current facility-administered medications for this encounter.    acetaminophen (TYLENOL) 500 MG tablet   diltiazem (CARDIZEM CD) 240 MG 24 hr capsule   lisinopril (PRINIVIL,ZESTRIL) 40 MG tablet   omeprazole (PRILOSEC) 20 MG capsule   rivaroxaban (XARELTO) 20 MG TABS tablet   simvastatin (ZOCOR) 10 MG tablet   Vitamin D, Ergocalciferol, (DRISDOL) 50000 units CAPS capsule   History:   Past Medical History:  Diagnosis Date   Arthritis    Atrial fibrillation (HCC)    a.) CHA2DS2-VASc = 7 (age x 2, sex, HTN, TIA x 2, T2DM). b.) rate/rhythm maintained on oral diltiazem; chronically anticoagulated using  rivaroxaban   B12 deficiency    Complication of anesthesia    hard time waking up after kyphoplasty   COPD (chronic obstructive pulmonary disease) (HCC)    DDD (degenerative disc disease), lumbar    Diverticulosis    GERD (gastroesophageal reflux disease)    History of kidney stones    HLD (hyperlipidemia)    Hypertension    Long term current use of anticoagulant    a.) Rivaroxaban   Post-menopausal osteoporosis    Post-poliomyelitis muscular atrophy    a.) LLE weakness; wears brace   T2DM (type 2 diabetes mellitus) ([place])    TIA (transient ischemic attack)    Venous insufficiency of both lower extremities    Vitamin D deficiency    Past Surgical History:  Procedure Laterality Date   CATARACT EXTRACTION W/PHACO Left 05/30/2018   Procedure: CATARACT EXTRACTION PHACO AND INTRAOCULAR LENS PLACEMENT ([place]) LEFT;  Surgeon: Eulogio Bear, MD;  Location: [facility];  Service: Ophthalmology;  Laterality: Left;   FEMUR FRACTURE SURGERY Right 2010   HIP SURGERY Left    fx   KYPHOPLASTY     Family History  Problem Relation Age of Onset   Stroke Mother    Heart attack Father    Diabetes Sister    Heart attack Sister    Cancer Brother    Social History   Tobacco Use   Smoking status: Never   Smokeless tobacco: Never  Vaping Use   Vaping Use: Never used  Substance Use Topics   Alcohol use: No   Drug  use: No    Pertinent Clinical Results:  LABS: Labs reviewed: Acceptable for surgery.  Hospital Outpatient Visit on 04/30/2021  Component Date Value Ref Range Status   MRSA, PCR 04/30/2021 NEGATIVE  NEGATIVE Final   Staphylococcus aureus 04/30/2021 NEGATIVE  NEGATIVE Final   Comment: (NOTE) The Xpert SA Assay (FDA approved for NASAL specimens in patients 47 years of age and older), is one component of a comprehensive surveillance program. It is not intended to diagnose infection nor to guide or monitor treatment. Performed at [facility], [place], [place] [address]    Color, Urine 04/30/2021 YELLOW (A)  YELLOW Final   APPearance 04/30/2021 HAZY (A)  CLEAR Final   Specific Gravity, Urine 04/30/2021 1.018  1.005 - 1.030 Final   pH 04/30/2021 8.0  5.0 - 8.0 Final   Glucose, UA 04/30/2021 NEGATIVE  NEGATIVE mg/dL Final   Hgb urine dipstick 04/30/2021 NEGATIVE  NEGATIVE Final   Bilirubin Urine 04/30/2021 NEGATIVE  NEGATIVE Final   Ketones, ur 04/30/2021 NEGATIVE  NEGATIVE mg/dL Final   Protein, ur 04/30/2021 NEGATIVE  NEGATIVE mg/dL Final   Nitrite 04/30/2021 NEGATIVE  NEGATIVE Final   Leukocytes,Ua 04/30/2021 NEGATIVE  NEGATIVE Final   Performed at [facility], [address] [place], [place] [address]   ABO/RH(D) 04/30/2021 O POS   Final   Antibody Screen 04/30/2021 NEG   Final   Sample Expiration 04/30/2021 05/14/2021,2359   Final   Extend sample reason 04/30/2021    Final                   Value:NO TRANSFUSIONS OR PREGNANCY IN THE PAST 3 MONTHS Performed at [facility], [address] [place], [place] [address]     Lab Results  Component Value Date   WBC 7.7 03/30/2021   HGB 11.6 (L) 03/30/2021   HCT 35.9 (L) 03/30/2021   MCV 93.7 03/30/2021   PLT 288 03/30/2021   Lab Results  Component Value Date   NA 135 03/30/2021   K 3.5 03/30/2021   CO2 23 03/30/2021   GLUCOSE 263 (H) 03/30/2021   BUN 28 (H) 03/30/2021   CREATININE 0.74 03/30/2021   CALCIUM 9.5 03/30/2021   GFRNONAA >60 03/30/2021    ECG: Date: 03/30/2021 Time ECG obtained: 2230 PM Rate: 80 bpm Rhythm: normal sinus Axis (leads I and aVF): Normal Intervals: PR 200 ms. QRS 82 ms. QTc 435 ms. ST segment and T wave changes: No evidence of acute ST segment elevation or depression. Evidence of an age undetermined anterior infarct present.  Comparison: Similar to previous tracing obtained on 08/28/2020   IMAGING / PROCEDURES: CT SHOULDER LEFT WITHOUT CONTRAST performed on 04/17/2021 Mild glenohumeral osteoarthritis.  Moderate acromioclavicular osteoarthritis. Moderate anterior supraspinatus muscle atrophy. Moderate glenohumeral joint effusion. Note is made on recent MRI of humeral avulsion of the inferior glenohumeral ligament and hemorrhagic blood products seen inferior to the torn ligament. On the current CT, there are scattered calcifications within the glenohumeral joint fluid. Question whether these may be secondary to evolution of the joint fluid blood products seen on prior MRI following anterior shoulder dislocation 03/30/2021  MRI SHOULDER LEFT WITHOUT CONTRAST performed on 04/11/2021 Complete tear of the supraspinatus and infraspinatus tendons with 3.9 cm of retraction. Moderate tendinosis of the teres minor and subscapularis tendon. Moderate tendinosis of the intra-articular portion of the long head of the biceps tendon. Humeral avulsion of the inferior glenohumeral ligament Hemorrhagic fluid in the soft tissues inferior to the  torn glenohumeral ligament.  TRANSTHORACIC ECHOCARDIOGRAM performed on 11/20/2020 LVEF >55% Normal left ventricular systolic function with mild LVH Normal right ventricular systolic function Diastolic Doppler parameters consistent with impaired relaxation (G1DD) Trivial MR Mild TR No AR or PR No valvular stenosis No pericardial effusion  Impression and Plan:  Khamryn Kingsmore Pollick has been referred for pre-anesthesia review and clearance prior to her undergoing the planned anesthetic and procedural courses. Available labs, pertinent testing, and imaging results were personally reviewed by me. This patient has been appropriately cleared by cardiology with an overall LOW risk of significant perioperative cardiovascular complications.  Based on clinical review performed today (05/01/21), barring any significant acute changes in the patient's overall condition, it is anticipated that she will be able to proceed with the planned surgical intervention. Any acute changes in  clinical condition may necessitate her procedure being postponed and/or cancelled. Patient will meet with anesthesia team (MD and/or CRNA) on the day of her procedure for preoperative evaluation/assessment. Questions regarding anesthetic course will be fielded at that time.   Pre-surgical instructions were reviewed with the patient during her PAT appointment and questions were fielded by PAT clinical staff. Patient was advised that if any questions or concerns arise prior to her procedure then she should return a call to PAT and/or her surgeon's office to discuss.  Honor Loh, MSN, APRN, FNP-C, CEN [facility]  Peri-operative Services Nurse Practitioner Phone: [phone] Fax: [phone] 05/01/21 8:03 AM  NOTE: This note has been prepared using Dragon dictation software. Despite my best ability to proofread, there is always the potential that unintentional transcriptional errors may still occur from this process.

## 2021-05-02 ENCOUNTER — Other Ambulatory Visit
Admission: RE | Admit: 2021-05-02 | Discharge: 2021-05-02 | Disposition: A | Discharge status: Home / Self Care | Payer: Medicare Other | Source: Ambulatory Visit | Attending: Orthopedic Surgery | Admitting: Orthopedic Surgery

## 2021-05-02 DIAGNOSIS — Z20822 Contact with and (suspected) exposure to covid-19: Secondary | ICD-10-CM

## 2021-05-02 DIAGNOSIS — Z01812 Encounter for preprocedural laboratory examination: Secondary | ICD-10-CM | POA: Insufficient documentation

## 2021-05-02 LAB — SARS CORONAVIRUS 2 (TAT 6-24 HRS): SARS Coronavirus 2: NEGATIVE

## 2021-05-04 MED ORDER — ORAL CARE MOUTH RINSE: 15.0000 mL | Freq: Once | OROMUCOSAL | Status: AC

## 2021-05-04 MED ORDER — SODIUM CHLORIDE 0.9 % IV SOLN: INTRAVENOUS | Status: DC

## 2021-05-04 MED ORDER — CHLORHEXIDINE GLUCONATE 0.12 % MT SOLN: 15.0000 mL | Freq: Once | OROMUCOSAL | Status: AC

## 2021-05-04 MED ORDER — TRANEXAMIC ACID-NACL 1000-0.7 MG/100ML-% IV SOLN
1000.0000 mg | INTRAVENOUS | Status: AC
  Administered 2021-05-05: 1000 mg via INTRAVENOUS

## 2021-05-04 MED ORDER — CEFAZOLIN SODIUM-DEXTROSE 2-4 GM/100ML-% IV SOLN
2.0000 g | INTRAVENOUS | Status: AC
  Administered 2021-05-05: 2 g via INTRAVENOUS

## 2021-05-05 ENCOUNTER — Other Ambulatory Visit: Payer: Self-pay

## 2021-05-05 ENCOUNTER — Inpatient Hospital Stay: Payer: Medicare Other

## 2021-05-05 ENCOUNTER — Inpatient Hospital Stay: Payer: Medicare Other | Admitting: Urgent Care

## 2021-05-05 ENCOUNTER — Encounter: Payer: Self-pay | Admitting: Orthopedic Surgery

## 2021-05-05 ENCOUNTER — Inpatient Hospital Stay
Admission: RE | Admit: 2021-05-05 | Discharge: 2021-05-07 | DRG: 483 | Disposition: A | Discharge status: Rehabilitation Facility | Payer: Medicare Other | Attending: Orthopedic Surgery | Admitting: Orthopedic Surgery

## 2021-05-05 ENCOUNTER — Encounter
Admission: RE | Disposition: A | Discharge status: Rehabilitation Facility | Payer: Self-pay | Source: Home / Self Care | Attending: Orthopedic Surgery

## 2021-05-05 DIAGNOSIS — M62512 Muscle wasting and atrophy, not elsewhere classified, left shoulder: Secondary | ICD-10-CM | POA: Diagnosis present

## 2021-05-05 DIAGNOSIS — Z79899 Other long term (current) drug therapy: Secondary | ICD-10-CM

## 2021-05-05 DIAGNOSIS — Z881 Allergy status to other antibiotic agents status: Secondary | ICD-10-CM

## 2021-05-05 DIAGNOSIS — Z888 Allergy status to other drugs, medicaments and biological substances status: Secondary | ICD-10-CM

## 2021-05-05 DIAGNOSIS — W19XXXA Unspecified fall, initial encounter: Secondary | ICD-10-CM | POA: Diagnosis present

## 2021-05-05 DIAGNOSIS — R531 Weakness: Secondary | ICD-10-CM | POA: Diagnosis present

## 2021-05-05 DIAGNOSIS — B91 Sequelae of poliomyelitis: Secondary | ICD-10-CM | POA: Diagnosis not present

## 2021-05-05 DIAGNOSIS — Z823 Family history of stroke: Secondary | ICD-10-CM

## 2021-05-05 DIAGNOSIS — M75122 Complete rotator cuff tear or rupture of left shoulder, not specified as traumatic: Principal | ICD-10-CM | POA: Diagnosis present

## 2021-05-05 DIAGNOSIS — M25512 Pain in left shoulder: Secondary | ICD-10-CM | POA: Diagnosis present

## 2021-05-05 DIAGNOSIS — Z419 Encounter for procedure for purposes other than remedying health state, unspecified: Secondary | ICD-10-CM

## 2021-05-05 DIAGNOSIS — E119 Type 2 diabetes mellitus without complications: Secondary | ICD-10-CM | POA: Diagnosis present

## 2021-05-05 DIAGNOSIS — Z8673 Personal history of transient ischemic attack (TIA), and cerebral infarction without residual deficits: Secondary | ICD-10-CM

## 2021-05-05 DIAGNOSIS — Z8249 Family history of ischemic heart disease and other diseases of the circulatory system: Secondary | ICD-10-CM

## 2021-05-05 DIAGNOSIS — M810 Age-related osteoporosis without current pathological fracture: Secondary | ICD-10-CM | POA: Diagnosis present

## 2021-05-05 DIAGNOSIS — M19012 Primary osteoarthritis, left shoulder: Secondary | ICD-10-CM | POA: Diagnosis present

## 2021-05-05 DIAGNOSIS — E559 Vitamin D deficiency, unspecified: Secondary | ICD-10-CM | POA: Diagnosis present

## 2021-05-05 DIAGNOSIS — Z7901 Long term (current) use of anticoagulants: Secondary | ICD-10-CM | POA: Diagnosis not present

## 2021-05-05 DIAGNOSIS — I4891 Unspecified atrial fibrillation: Secondary | ICD-10-CM | POA: Diagnosis present

## 2021-05-05 DIAGNOSIS — Z833 Family history of diabetes mellitus: Secondary | ICD-10-CM | POA: Diagnosis not present

## 2021-05-05 DIAGNOSIS — Z20822 Contact with and (suspected) exposure to covid-19: Secondary | ICD-10-CM | POA: Diagnosis present

## 2021-05-05 DIAGNOSIS — E538 Deficiency of other specified B group vitamins: Secondary | ICD-10-CM | POA: Diagnosis present

## 2021-05-05 DIAGNOSIS — Z87442 Personal history of urinary calculi: Secondary | ICD-10-CM | POA: Diagnosis not present

## 2021-05-05 DIAGNOSIS — K219 Gastro-esophageal reflux disease without esophagitis: Secondary | ICD-10-CM | POA: Diagnosis present

## 2021-05-05 DIAGNOSIS — J449 Chronic obstructive pulmonary disease, unspecified: Secondary | ICD-10-CM | POA: Diagnosis present

## 2021-05-05 DIAGNOSIS — I10 Essential (primary) hypertension: Secondary | ICD-10-CM | POA: Diagnosis present

## 2021-05-05 DIAGNOSIS — Z96619 Presence of unspecified artificial shoulder joint: Secondary | ICD-10-CM

## 2021-05-05 DIAGNOSIS — E785 Hyperlipidemia, unspecified: Secondary | ICD-10-CM | POA: Diagnosis present

## 2021-05-05 DIAGNOSIS — I872 Venous insufficiency (chronic) (peripheral): Secondary | ICD-10-CM | POA: Diagnosis present

## 2021-05-05 HISTORY — DX: Postpolio syndrome: G14

## 2021-05-05 HISTORY — DX: Venous insufficiency (chronic) (peripheral): I87.2

## 2021-05-05 HISTORY — DX: Other intervertebral disc degeneration, lumbar region: M51.36

## 2021-05-05 HISTORY — DX: Hyperlipidemia, unspecified: E78.5

## 2021-05-05 HISTORY — DX: Chronic obstructive pulmonary disease, unspecified: J44.9

## 2021-05-05 HISTORY — PX: REVERSE SHOULDER ARTHROPLASTY: SHX5054

## 2021-05-05 HISTORY — DX: Type 2 diabetes mellitus without complications: E11.9

## 2021-05-05 HISTORY — DX: Age-related osteoporosis without current pathological fracture: M81.0

## 2021-05-05 HISTORY — DX: Vitamin D deficiency, unspecified: E55.9

## 2021-05-05 HISTORY — DX: Deficiency of other specified B group vitamins: E53.8

## 2021-05-05 HISTORY — DX: Transient cerebral ischemic attack, unspecified: G45.9

## 2021-05-05 HISTORY — DX: Diverticulosis of intestine, part unspecified, without perforation or abscess without bleeding: K57.90

## 2021-05-05 HISTORY — DX: Long term (current) use of anticoagulants: Z79.01

## 2021-05-05 LAB — GLUCOSE, CAPILLARY
Glucose-Capillary: 184 mg/dL — ABNORMAL HIGH (ref 70–99)
Glucose-Capillary: 136 mg/dL — ABNORMAL HIGH (ref 70–99)

## 2021-05-05 SURGERY — ARTHROPLASTY, SHOULDER, TOTAL, REVERSE
Anesthesia: General | Site: Shoulder | Laterality: Left

## 2021-05-05 MED ORDER — SODIUM CHLORIDE 0.9 % IV SOLN: INTRAVENOUS | Status: DC

## 2021-05-05 MED ORDER — BUPIVACAINE LIPOSOME 1.3 % IJ SUSP
INTRAMUSCULAR | Status: AC
  Filled 2021-05-05: qty 10

## 2021-05-05 MED ORDER — ACETAMINOPHEN 500 MG PO TABS
ORAL_TABLET | ORAL | Status: AC
  Filled 2021-05-05: qty 2

## 2021-05-05 MED ORDER — ALUM & MAG HYDROXIDE-SIMETH 200-200-20 MG/5ML PO SUSP: 30.0000 mL | ORAL | Status: DC | PRN

## 2021-05-05 MED ORDER — DILTIAZEM HCL ER COATED BEADS 240 MG PO CP24
240.0000 mg | ORAL_CAPSULE | ORAL | Status: DC
  Administered 2021-05-06 – 2021-05-07 (×2): 240 mg via ORAL
  Filled 2021-05-05 (×2): qty 1

## 2021-05-05 MED ORDER — CHLORHEXIDINE GLUCONATE 0.12 % MT SOLN
OROMUCOSAL | Status: AC
  Administered 2021-05-05: 15 mL via OROMUCOSAL
  Filled 2021-05-05: qty 15

## 2021-05-05 MED ORDER — NEOMYCIN-POLYMYXIN B GU 40-200000 IR SOLN
Status: AC
  Filled 2021-05-05: qty 1

## 2021-05-05 MED ORDER — FENTANYL CITRATE PF 50 MCG/ML IJ SOSY: 50.0000 ug | PREFILLED_SYRINGE | Freq: Once | INTRAMUSCULAR | Status: AC

## 2021-05-05 MED ORDER — BISACODYL 10 MG RE SUPP
10.0000 mg | Freq: Every day | RECTAL | Status: DC | PRN
  Filled 2021-05-05: qty 1

## 2021-05-05 MED ORDER — TRANEXAMIC ACID-NACL 1000-0.7 MG/100ML-% IV SOLN: 1000.0000 mg | Freq: Once | INTRAVENOUS | Status: AC

## 2021-05-05 MED ORDER — FENTANYL CITRATE (PF) 100 MCG/2ML IJ SOLN
INTRAMUSCULAR | Status: AC
  Filled 2021-05-05: qty 2

## 2021-05-05 MED ORDER — SODIUM CHLORIDE 0.9 % IR SOLN
Status: DC | PRN
  Administered 2021-05-05: 1000 mL

## 2021-05-05 MED ORDER — PANTOPRAZOLE SODIUM 40 MG PO TBEC
40.0000 mg | DELAYED_RELEASE_TABLET | Freq: Every day | ORAL | Status: DC
Start: 2021-05-06 — End: 2021-05-07
  Administered 2021-05-06: 40 mg via ORAL

## 2021-05-05 MED ORDER — TRANEXAMIC ACID-NACL 1000-0.7 MG/100ML-% IV SOLN
INTRAVENOUS | Status: AC
  Administered 2021-05-05: 1000 mg via INTRAVENOUS
  Filled 2021-05-05: qty 100

## 2021-05-05 MED ORDER — BUPIVACAINE HCL (PF) 0.5 % IJ SOLN
INTRAMUSCULAR | Status: DC | PRN
  Administered 2021-05-05: 10 mL via PERINEURAL

## 2021-05-05 MED ORDER — VANCOMYCIN HCL 1000 MG IV SOLR
INTRAVENOUS | Status: AC
  Filled 2021-05-05: qty 20

## 2021-05-05 MED ORDER — VANCOMYCIN HCL 1000 MG IV SOLR
INTRAVENOUS | Status: DC | PRN
  Administered 2021-05-05: 1000 mg

## 2021-05-05 MED ORDER — LIDOCAINE HCL (CARDIAC) PF 100 MG/5ML IV SOSY
PREFILLED_SYRINGE | INTRAVENOUS | Status: DC | PRN
Start: 2021-05-05 — End: 2021-05-05
  Administered 2021-05-05: 50 mg via INTRAVENOUS

## 2021-05-05 MED ORDER — OXYCODONE HCL 5 MG PO TABS
5.0000 mg | ORAL_TABLET | ORAL | Status: DC | PRN
  Administered 2021-05-07: 5 mg via ORAL

## 2021-05-05 MED ORDER — DOCUSATE SODIUM 100 MG PO CAPS: 100.0000 mg | ORAL_CAPSULE | Freq: Two times a day (BID) | ORAL | Status: DC

## 2021-05-05 MED ORDER — SENNOSIDES-DOCUSATE SODIUM 8.6-50 MG PO TABS
1.0000 | ORAL_TABLET | Freq: Every evening | ORAL | Status: DC | PRN
  Administered 2021-05-06: 1 via ORAL
  Filled 2021-05-05 (×2): qty 1

## 2021-05-05 MED ORDER — ONDANSETRON HCL 4 MG/2ML IJ SOLN
INTRAMUSCULAR | Status: DC | PRN
  Administered 2021-05-05: 4 mg via INTRAVENOUS

## 2021-05-05 MED ORDER — RIVAROXABAN 20 MG PO TABS
20.0000 mg | ORAL_TABLET | Freq: Every day | ORAL | Status: DC
  Administered 2021-05-06: 20 mg via ORAL
  Filled 2021-05-05 (×2): qty 1

## 2021-05-05 MED ORDER — METOCLOPRAMIDE HCL 10 MG PO TABS: 5.0000 mg | ORAL_TABLET | Freq: Three times a day (TID) | ORAL | Status: DC | PRN

## 2021-05-05 MED ORDER — HYDROMORPHONE HCL 1 MG/ML IJ SOLN: 0.2000 mg | INTRAMUSCULAR | Status: DC | PRN

## 2021-05-05 MED ORDER — VITAMIN D (ERGOCALCIFEROL) 1.25 MG (50000 UNIT) PO CAPS: 50000.0000 [IU] | ORAL_CAPSULE | ORAL | Status: DC

## 2021-05-05 MED ORDER — TRANEXAMIC ACID-NACL 1000-0.7 MG/100ML-% IV SOLN
INTRAVENOUS | Status: AC
  Filled 2021-05-05: qty 100

## 2021-05-05 MED ORDER — NEOMYCIN-POLYMYXIN B GU 40-200000 IR SOLN
Status: DC | PRN
  Administered 2021-05-05: 4 mL

## 2021-05-05 MED ORDER — WHITE PETROLATUM EX OINT
TOPICAL_OINTMENT | CUTANEOUS | Status: AC
  Filled 2021-05-05: qty 5

## 2021-05-05 MED ORDER — 0.9 % SODIUM CHLORIDE (POUR BTL) OPTIME
TOPICAL | Status: DC | PRN
Start: 2021-05-05 — End: 2021-05-05
  Administered 2021-05-05: 1000 mL

## 2021-05-05 MED ORDER — PHENYLEPHRINE HCL-NACL 20-0.9 MG/250ML-% IV SOLN
INTRAVENOUS | Status: DC | PRN
  Administered 2021-05-05: 20 ug/min via INTRAVENOUS

## 2021-05-05 MED ORDER — PHENYLEPHRINE HCL (PRESSORS) 10 MG/ML IV SOLN
INTRAVENOUS | Status: DC | PRN
  Administered 2021-05-05 (×4): 100 ug via INTRAVENOUS

## 2021-05-05 MED ORDER — ACETAMINOPHEN 500 MG PO TABS
1000.0000 mg | ORAL_TABLET | Freq: Three times a day (TID) | ORAL | Status: AC
  Administered 2021-05-05 – 2021-05-06 (×4): 1000 mg via ORAL

## 2021-05-05 MED ORDER — PROPOFOL 10 MG/ML IV BOLUS
INTRAVENOUS | Status: DC | PRN
  Administered 2021-05-05: 100 mg via INTRAVENOUS

## 2021-05-05 MED ORDER — ONDANSETRON HCL 4 MG/2ML IJ SOLN: 4.0000 mg | Freq: Four times a day (QID) | INTRAMUSCULAR | Status: DC | PRN

## 2021-05-05 MED ORDER — MIDAZOLAM HCL 2 MG/2ML IJ SOLN: 0.5000 mg | Freq: Once | INTRAMUSCULAR | Status: AC

## 2021-05-05 MED ORDER — BUPIVACAINE LIPOSOME 1.3 % IJ SUSP
INTRAMUSCULAR | Status: DC | PRN
  Administered 2021-05-05: 10 mL via PERINEURAL

## 2021-05-05 MED ORDER — CEFAZOLIN SODIUM-DEXTROSE 2-4 GM/100ML-% IV SOLN
2.0000 g | Freq: Three times a day (TID) | INTRAVENOUS | Status: AC
  Administered 2021-05-05 – 2021-05-06 (×2): 2 g via INTRAVENOUS

## 2021-05-05 MED ORDER — CEFAZOLIN SODIUM-DEXTROSE 2-4 GM/100ML-% IV SOLN
INTRAVENOUS | Status: AC
  Filled 2021-05-05: qty 100

## 2021-05-05 MED ORDER — DOCUSATE SODIUM 100 MG PO CAPS
ORAL_CAPSULE | ORAL | Status: AC
  Filled 2021-05-05: qty 1

## 2021-05-05 MED ORDER — PHENOL 1.4 % MT LIQD
1.0000 | OROMUCOSAL | Status: DC | PRN
  Filled 2021-05-05: qty 177

## 2021-05-05 MED ORDER — MIDAZOLAM HCL 2 MG/2ML IJ SOLN
INTRAMUSCULAR | Status: AC
  Administered 2021-05-05: 0.5 mg via INTRAVENOUS
  Filled 2021-05-05: qty 2

## 2021-05-05 MED ORDER — DEXAMETHASONE SODIUM PHOSPHATE 10 MG/ML IJ SOLN
INTRAMUSCULAR | Status: DC | PRN
Start: 2021-05-05 — End: 2021-05-05
  Administered 2021-05-05: 5 mg via INTRAVENOUS

## 2021-05-05 MED ORDER — LISINOPRIL 20 MG PO TABS
40.0000 mg | ORAL_TABLET | ORAL | Status: DC
  Administered 2021-05-06 – 2021-05-07 (×2): 40 mg via ORAL
  Filled 2021-05-05 (×2): qty 2

## 2021-05-05 MED ORDER — ONDANSETRON HCL 4 MG PO TABS: 4.0000 mg | ORAL_TABLET | Freq: Four times a day (QID) | ORAL | Status: DC | PRN

## 2021-05-05 MED ORDER — ACETAMINOPHEN 10 MG/ML IV SOLN
INTRAVENOUS | Status: DC | PRN
  Administered 2021-05-05: 1000 mg via INTRAVENOUS

## 2021-05-05 MED ORDER — SIMVASTATIN 10 MG PO TABS
10.0000 mg | ORAL_TABLET | Freq: Every day | ORAL | Status: DC
  Administered 2021-05-06: 10 mg via ORAL
  Filled 2021-05-05 (×3): qty 1

## 2021-05-05 MED ORDER — OXYCODONE HCL 5 MG PO TABS: 2.5000 mg | ORAL_TABLET | ORAL | Status: DC | PRN

## 2021-05-05 MED ORDER — METOCLOPRAMIDE HCL 5 MG/ML IJ SOLN: 5.0000 mg | Freq: Three times a day (TID) | INTRAMUSCULAR | Status: DC | PRN

## 2021-05-05 MED ORDER — FENTANYL CITRATE (PF) 100 MCG/2ML IJ SOLN
INTRAMUSCULAR | Status: DC | PRN
  Administered 2021-05-05: 50 ug via INTRAVENOUS

## 2021-05-05 MED ORDER — ACETAMINOPHEN 10 MG/ML IV SOLN
INTRAVENOUS | Status: AC
  Filled 2021-05-05: qty 100

## 2021-05-05 MED ORDER — MENTHOL 3 MG MT LOZG
1.0000 | LOZENGE | OROMUCOSAL | Status: DC | PRN
  Filled 2021-05-05: qty 9

## 2021-05-05 MED ORDER — FENTANYL CITRATE PF 50 MCG/ML IJ SOSY
PREFILLED_SYRINGE | INTRAMUSCULAR | Status: AC
  Administered 2021-05-05: 50 ug via INTRAVENOUS
  Filled 2021-05-05: qty 1

## 2021-05-05 MED ORDER — ROCURONIUM BROMIDE 100 MG/10ML IV SOLN
INTRAVENOUS | Status: DC | PRN
  Administered 2021-05-05: 30 mg via INTRAVENOUS

## 2021-05-05 MED ORDER — BUPIVACAINE HCL (PF) 0.5 % IJ SOLN
INTRAMUSCULAR | Status: AC
  Filled 2021-05-05: qty 10

## 2021-05-05 MED ORDER — SUGAMMADEX SODIUM 200 MG/2ML IV SOLN
INTRAVENOUS | Status: DC | PRN
Start: 2021-05-05 — End: 2021-05-05
  Administered 2021-05-05: 100 mg via INTRAVENOUS

## 2021-05-05 SURGICAL SUPPLY — 82 items
ADH SKN CLS APL DERMABOND .7 (GAUZE/BANDAGES/DRESSINGS) ×1
APL PRP STRL LF DISP 70% ISPRP (MISCELLANEOUS) ×2
BASEPLATE P2 COATD GLND 6.5X30 (Shoulder) IMPLANT
BIT DRILL 4 DIA CALIBRATED (BIT) ×1 IMPLANT
BIT DRILL GUIDE PATIENT MATCH (MISCELLANEOUS) IMPLANT
BLADE SAGITTAL WIDE XTHICK NO (BLADE) ×2 IMPLANT
BSPLAT GLND 30 STRL LF SHLDR (Shoulder) ×1 IMPLANT
CHLORAPREP W/TINT 26 (MISCELLANEOUS) ×3 IMPLANT
CNTNR SPEC 2.5X3XGRAD LEK (MISCELLANEOUS) ×1
CONT SPEC 4OZ STER OR WHT (MISCELLANEOUS) ×1
CONT SPEC 4OZ STRL OR WHT (MISCELLANEOUS) ×1
CONTAINER SPEC 2.5X3XGRAD LEK (MISCELLANEOUS) ×1 IMPLANT
COOLER POLAR GLACIER W/PUMP (MISCELLANEOUS) ×2 IMPLANT
COVER BACK TABLE REUSABLE LG (DRAPES) ×2 IMPLANT
DERMABOND ADVANCED (GAUZE/BANDAGES/DRESSINGS) ×1
DERMABOND ADVANCED .7 DNX12 (GAUZE/BANDAGES/DRESSINGS) ×1 IMPLANT
DRAPE 3/4 80X56 (DRAPES) ×4 IMPLANT
DRAPE INCISE IOBAN 66X45 STRL (DRAPES) ×4 IMPLANT
DRAPE U-SHAPE 47X51 STRL (DRAPES) ×5 IMPLANT
DRILL BIT 4.0MM STERILE (BIT) ×1 IMPLANT
DRILL GUIDE PATIENT MATCH (MISCELLANEOUS) ×2
DRSG OPSITE POSTOP 3X4 (GAUZE/BANDAGES/DRESSINGS) ×1 IMPLANT
DRSG OPSITE POSTOP 4X6 (GAUZE/BANDAGES/DRESSINGS) ×1 IMPLANT
DRSG OPSITE POSTOP 4X8 (GAUZE/BANDAGES/DRESSINGS) ×2 IMPLANT
DRSG TEGADERM 4X10 (GAUZE/BANDAGES/DRESSINGS) ×6 IMPLANT
DRSG TEGADERM 4X4.75 (GAUZE/BANDAGES/DRESSINGS) ×1 IMPLANT
ELECT REM PT RETURN 9FT ADLT (ELECTROSURGICAL) ×2
ELECTRODE REM PT RTRN 9FT ADLT (ELECTROSURGICAL) ×1 IMPLANT
GAUZE XEROFORM 1X8 LF (GAUZE/BANDAGES/DRESSINGS) ×2 IMPLANT
GLOVE SRG 8 PF TXTR STRL LF DI (GLOVE) ×2 IMPLANT
GLOVE SURG GAMMEX PI TX LF 7.5 (GLOVE) ×4 IMPLANT
GLOVE SURG ORTHO LTX SZ8 (GLOVE) ×5 IMPLANT
GLOVE SURG SYN 7.0 (GLOVE) ×4 IMPLANT
GLOVE SURG SYN 7.0 PF PI (GLOVE) IMPLANT
GLOVE SURG SYN 8.0 (GLOVE) ×2 IMPLANT
GLOVE SURG SYN 8.0 PF PI (GLOVE) ×1 IMPLANT
GLOVE SURG UNDER POLY LF SZ8 (GLOVE) ×4
GOWN STRL REUS W/ TWL LRG LVL3 (GOWN DISPOSABLE) ×2 IMPLANT
GOWN STRL REUS W/ TWL XL LVL3 (GOWN DISPOSABLE) ×1 IMPLANT
GOWN STRL REUS W/TWL LRG LVL3 (GOWN DISPOSABLE) ×4
GOWN STRL REUS W/TWL XL LVL3 (GOWN DISPOSABLE) ×2
HEMOVAC 400CC 10FR (MISCELLANEOUS) ×2 IMPLANT
HOOD PEEL AWAY FLYTE STAYCOOL (MISCELLANEOUS) ×6 IMPLANT
INSERT SMALL SOCKET 32MM NEU (Insert) ×1 IMPLANT
KIT STABILIZATION SHOULDER (MISCELLANEOUS) ×2 IMPLANT
MANIFOLD NEPTUNE II (INSTRUMENTS) ×2 IMPLANT
MASK FACE SPIDER DISP (MASK) ×2 IMPLANT
MAT ABSORB  FLUID 56X50 GRAY (MISCELLANEOUS) ×1
MAT ABSORB FLUID 56X50 GRAY (MISCELLANEOUS) ×2 IMPLANT
NDL REVERSE CUT 1/2 CRC (NEEDLE) ×1 IMPLANT
NDL SPNL 20GX3.5 QUINCKE YW (NEEDLE) ×1 IMPLANT
NEEDLE REVERSE CUT 1/2 CRC (NEEDLE) ×2 IMPLANT
NEEDLE SPNL 20GX3.5 QUINCKE YW (NEEDLE) ×2 IMPLANT
NS IRRIG 1000ML POUR BTL (IV SOLUTION) ×2 IMPLANT
P2 COATDE GLNOID BSEPLT 6.5X30 (Shoulder) ×2 IMPLANT
PACK ARTHROSCOPY SHOULDER (MISCELLANEOUS) ×2 IMPLANT
PAD WRAPON POLAR SHDR XLG (MISCELLANEOUS) ×1 IMPLANT
PULSAVAC PLUS IRRIG FAN TIP (DISPOSABLE) ×2
SCREW BONE LOCKING RSP 5.0X34 (Screw) ×2 IMPLANT
SCREW BONE LOCKING RSP 5.0X38 (Screw) ×2 IMPLANT
SCREW BONE RSP LOCK 5X34 (Screw) IMPLANT
SCREW BONE RSP LOCK 5X38 (Screw) IMPLANT
SCREW RETAIN W/HEAD 4MM OFFSET (Shoulder) ×1 IMPLANT
SLING ULTRA II LG (MISCELLANEOUS) ×1 IMPLANT
SLING ULTRA II M (MISCELLANEOUS) ×1 IMPLANT
SPONGE GAUZE 2X2 8PLY STRL LF (GAUZE/BANDAGES/DRESSINGS) ×1 IMPLANT
SPONGE T-LAP 18X18 ~~LOC~~+RFID (SPONGE) ×11 IMPLANT
STEM HUMERAL REVERSE S 10X108 (Stem) ×1 IMPLANT
STRAP SAFETY 5IN WIDE (MISCELLANEOUS) ×4 IMPLANT
SUT FIBERWIRE #2 38 BLUE 1/2 (SUTURE) ×2
SUT MNCRL AB 4-0 PS2 18 (SUTURE) IMPLANT
SUT PROLENE 6 0 P 1 18 (SUTURE) ×2 IMPLANT
SUT TICRON 2-0 30IN 311381 (SUTURE) ×6 IMPLANT
SUT VIC AB 0 CT1 36 (SUTURE) ×2 IMPLANT
SUT VIC AB 2-0 CT2 27 (SUTURE) ×4 IMPLANT
SUTURE FIBERWR #2 38 BLUE 1/2 (SUTURE) ×4 IMPLANT
SYR 20ML LL LF (SYRINGE) ×2 IMPLANT
SYR 30ML LL (SYRINGE) ×4 IMPLANT
TIP FAN IRRIG PULSAVAC PLUS (DISPOSABLE) ×1 IMPLANT
TRAY FOLEY SLVR 16FR LF STAT (SET/KITS/TRAYS/PACK) ×1 IMPLANT
WATER STERILE IRR 500ML POUR (IV SOLUTION) ×2 IMPLANT
WRAPON POLAR PAD SHDR XLG (MISCELLANEOUS) ×2

## 2021-05-05 NOTE — Evaluation (Signed)
Physical Therapy Evaluation Patient Details Name: Jennifer [name] MRN: [id number] DOB: 1931-08-22 Today's Date: 05/05/2021  History of Present Illness  Pt admitted for reverse total shoulder and is POD 0 at time of evaluation.  Clinical Impression  Pt is a pleasant 88 year old female who was admitted for reverse total shoulder. Pt performs bed mobility with min assist and politely refused to perform further mobility efforts. Pt wears AFO on L LE and will need to be donned prior to mobility. Pt typically uses RW for mobilization so anticipate she will need physical assist for all mobility. Currently does not have 24/7 care available and unable to get true accurate evaluation of mobility. Pt prefers to dc home with HHPT services, however will formally upgrade dispo once ambulation performed if deemed safe. Pt demonstrates deficits with strength/mobility/balance. Would benefit from skilled PT to address above deficits and promote optimal return to PLOF; recommend transition to STR upon discharge from acute hospitalization.      Recommendations for follow up therapy are one component of a multi-disciplinary discharge planning process, led by the attending physician.  Recommendations may be updated based on patient status, additional functional criteria and insurance authorization.  Follow Up Recommendations Skilled nursing-short term rehab (<3 hours/day)    Assistance Recommended at Discharge Intermittent Supervision/Assistance  Patient can return home with the following  A lot of help with walking and/or transfers;A lot of help with bathing/dressing/bathroom;Assistance with cooking/housework;Assist for transportation    Equipment Recommendations None recommended by PT  Recommendations for Other Services       Functional Status Assessment Patient has had a recent decline in their functional status and demonstrates the ability to make significant improvements in function in a reasonable and  predictable amount of time.     Precautions / Restrictions Precautions Precautions: Fall;Shoulder Type of Shoulder Precautions: shoulder abduction brace Shoulder Interventions: Shoulder abduction pillow Precaution Booklet Issued: No Restrictions Weight Bearing Restrictions: Yes LUE Weight Bearing: Non weight bearing      Mobility  Bed Mobility Overal bed mobility: Needs Assistance Bed Mobility: Supine to Sit     Supine to sit: Min assist     General bed mobility comments: needs assist for moving B LEs off bed. Once seated, slightly post leaning. Able to sit for prolonged time while therapist adjusted brace    Transfers                   General transfer comment: pt declined to stand despite encouragement due to fear/anxiety    Ambulation/Gait               General Gait Details: pt declined to attempt  Stairs            Wheelchair Mobility    Modified Rankin (Stroke Patients Only)       Balance Overall balance assessment: Needs assistance Sitting-balance support: Feet supported, Single extremity supported Sitting balance-Leahy Scale: Fair                                       Pertinent Vitals/Pain Pain Assessment Pain Assessment: No/denies pain    Home Living Family/patient expects to be discharged to:: Private residence Living Arrangements: Alone   Type of Home: House Home Access: Level entry       Home Layout: One level Home Equipment: Agricultural consultant (2 wheels);Wheelchair - manual (lift chair) Additional Comments: is working on getting  more in home assistance. plans to sleep in lift chair    Prior Function Prior Level of Function : Needs assist             Mobility Comments: was using RW PTA.       Hand Dominance        Extremity/Trunk Assessment   Upper Extremity Assessment Upper Extremity Assessment:  (L grip strength 4/5; R UE WNL)    Lower Extremity Assessment Lower Extremity Assessment:  Generalized weakness (L LE grossly 3+/5- baseline foot drop. R LE grossly 4/5)       Communication   Communication: No difficulties  Cognition Arousal/Alertness: Awake/alert Behavior During Therapy: WFL for tasks assessed/performed Overall Cognitive Status: Within Functional Limits for tasks assessed                                          General Comments      Exercises Other Exercises Other Exercises: Educated on hand ther-ex including wrist circles and grip squeezes. 10 reps   Assessment/Plan    PT Assessment Patient needs continued PT services  PT Problem List Decreased strength;Decreased activity tolerance;Decreased balance;Decreased mobility;Decreased knowledge of precautions       PT Treatment Interventions DME instruction;Gait training;Therapeutic exercise;Balance training    PT Goals (Current goals can be found in the Care Plan section)  Acute Rehab PT Goals Patient Stated Goal: to go home PT Goal Formulation: With patient Time For Goal Achievement: 05/19/21 Potential to Achieve Goals: Good    Frequency BID     Co-evaluation               AM-PAC PT "6 Clicks" Mobility  Outcome Measure Help needed turning from your back to your side while in a flat bed without using bedrails?: A Little Help needed moving from lying on your back to sitting on the side of a flat bed without using bedrails?: A Little Help needed moving to and from a bed to a chair (including a wheelchair)?: A Lot Help needed standing up from a chair using your arms (e.g., wheelchair or bedside chair)?: A Lot Help needed to walk in hospital room?: A Lot Help needed climbing 3-5 steps with a railing? : A Lot 6 Click Score: 14    End of Session   Activity Tolerance: Patient tolerated treatment well Patient left: in bed;with nursing/sitter in room;with family/visitor present Nurse Communication: Mobility status PT Visit Diagnosis: Muscle weakness (generalized)  (M62.81);Difficulty in walking, not elsewhere classified (R26.2);Unsteadiness on feet (R26.81)    Time: 6073-7106 PT Time Calculation (min) (ACUTE ONLY): 17 min   Charges:   PT Evaluation $PT Eval Low Complexity: 1 Low PT Treatments $Therapeutic Exercise: 8-22 mins        Elizabeth Palau, PT, DPT, GCS [phone]   Jennifer [name] 05/05/2021, 4:51 PM

## 2021-05-05 NOTE — H&P (Signed)
Paper H&P to be scanned into permanent record. H&P reviewed. No significant changes noted.  

## 2021-05-05 NOTE — Op Note (Signed)
SURGERY DATE: 05/05/2021   PRE-OP DIAGNOSIS:  1.  Left shoulder rotator cuff tear 2.  Left shoulder degenerative changes   POST-OP DIAGNOSIS:  1.  Left shoulder rotator cuff tear 2.  Left shoulder degenerative changes   PROCEDURES:  1. Left reverse total shoulder arthroplasty 2. Left biceps tenodesis   SURGEON: Cato Mulligan, MD  ASSISTANTS: Reche Dixon, PA   ANESTHESIA: Gen + Exparel interscalene block   ESTIMATED BLOOD LOSS: 200cc   TOTAL IV FLUIDS: per anesthesia record  IMPLANTS: [employer]: RSP Glenoid Head w/Retaining screw 32-4; Monoblock Reverse Shoulder Baseplate with 8.8KC central screw; 2 locking screws into baseplate (superior, inferior); Small Shell Humeral Stem 10 x 178m; Neutral Small Socket Insert;    INDICATION(S):  Jennifer KBabygirl [name] a 88y.o. female with inability to lift arm overhead after sustaining a glenohumeral joint dislocation with subsequent closed reduction on 03/30/2021.  Prior to this injury, the patient had no significant shoulder pain or dysfunction.  Imaging consistent with large rotator cuff tear and significant muscle atrophy with degenerative changes of the glenohumeral joint. After discussion of risks, benefits, and alternatives to surgery, the patient elected to proceed with reverse shoulder arthroplasty and biceps tenodesis.   OPERATIVE FINDINGS: Large cuff tear (complete supraspinatus, almost complete infraspinatus); significant biceps tendinopathy   OPERATIVE REPORT:   I identified Jennifer [name] in the pre-operative holding area. Informed consent was obtained and the surgical site was marked. I reviewed the risks and benefits of the proposed surgical intervention and the patient (and/or patient's guardian) wished to proceed. An interscalene block with Exparel was administered by the Anesthesia team. The patient was transferred to the operative suite and general anesthesia was administered. The patient was placed in the beach chair  position with the head of the bed elevated approximately 45 degrees. All down side pressure points were appropriately padded. Pre-op exam under anesthesia confirmed some stiffness and crepitus. Appropriate IV antibiotics were administered. The extremity was then prepped and draped in standard fashion. A time out was performed confirming the correct extremity, correct patient, and correct procedure.   We used the standard deltopectoral incision from the coracoid to ~10cm distal. We found the cephalic vein and took it laterally. We opened the deltopectoral interval widely and placed retractors under the CA ligament in the subacromial space and under the deltoid tendon at its insertion. We then abducted and internally rotated the arm and released the underlying bursa between these retractors, taking care not to damage the circumflex branch of the axillary nerve.   Next, we brought the arm back in adduction at slight forward flexion with external rotation. We opened the clavipectoral fascia lateral to the conjoint tendon. We gently palpated the axillary nerve and verified its position and continuity on both sides of the humerus with a Tug test. This test was repeated multiple times during the procedure for nerve localization and confirmed to be intact at the end of the case. We then cauterized the anterior humeral circumflex (Three sisters) vessels. The arm was then internally rotated, we cut the falciform ligament at approximately 1 cm of the upper portion of the pectoralis major insertion. Next we unroofed the bicipital groove. We proceeded with a soft tissue biceps tenodesis given the pathology (significant thickening and tendinopathy proximally) of the tendon.  After opening the biceps tendon sheath all the way to the supraglenoid tubercle, we performed a biceps tenodesis with two #2 TiCron sutures to the upper border of the pectoralis major. During this  process, the cephalic vein was nicked. It was repaired  with 6-0 Prolene suture but did not remain fully patent. The proximal portion of the biceps tendon was then excised.   At this point, we could see that the supraspinatus was completely torn with a bald humeral head superiorly.  The infraspinatus was also significantly torn and retracted.  The subscapularis was grossly intact. We performed a subscapularis peel using electrocautery to remove the anterior capsule and subscapularis off of the humeral head. We released the inferior capsule from the humerus all the way to the posterior band of the inferior glenohumeral ligament. When this was complete we gently dislocated the shoulder up into the wound. We removed any osteophytes and made our cut with the appropriate inclination in 30 degrees of retroversion  We then turned our attention back to the glenoid. The proximal humerus was retracted posteriorly. The anterior capsule was dissected free from the subscapularis. The anterior capsule was then excised, exposing the anterior glenoid. We then grasped the labrum and removed it circumferentially. During the glenoid exposure, the axillary nerve was protected the entire time.    A patient-specific guide was used to drill the central guidepin. A tap was placed, matching the trajectory of the guidepin. An appropriately sized reamer was used to ream the glenoid. The monoblock baseplate was inserted and excellent fixation was achieved such that the entire scapula rotated with further attempted seating of the baseplate. The peripheral screws were drilled, measured, and placed. A 32-4 glenosphere was then placed and tightened.   We then turned our attention back to the humerus. We sized for a small shell prosthesis. We sequentially used larger diameter canal finders until we met significant resistance and sequential broaching was performed to this size listed above. We trialed both an 0 poly. The humerus was trialed and noted to have satisfactory stability, motion, and  deltoid tension. The trial implants were removed. The humeral canal was pulse lavaged. 4 drill holes were placed about the lesser tuberosity footprint and FiberWire sutures were passed through these holes for subscapularis repair. Next, the implant was placed with the appropriate retroversion.  Prior to final seating, bone graft was placed underneath the shell and impacted into the canal to achieve better interference fit.  Stability of the implant was confirmed. We placed an 0 poly. The humerus was reduced and motion, tension, and stability were satisfactory. A Hemovac drain was placed. Subscapularis was repaired with the previously passed #2 FiberWire sutures after passing them through the subscapularis.   We again verified the tension on the axillary nerve, appropriate range of motion, stability of the implant, and security of the subscapularis repair. We closed the deltopectoral interval deep to the cephalic vein with a running, 0-Vicryl suture. The skin was closed with 2-0 Vicryl and 4-0 Monocryl. Xeroform and Honeycomb dressing was applied. A PolarCare unit and sling were placed. Patient was extubated, transferred to a stretcher bed and to the post antesthesia care unit in stable condition.   Of note, assistance from a PA was essential to performing the surgery.  PA was present for the entire surgery.  PA assisted with patient positioning, retraction, instrumentation, and wound closure. The surgery would have been more difficult and had longer operative time without PA assistance.    POSTOPERATIVE PLAN: The patient will be admitted with plan for discharge home versus SNF pending PT/OT evaluation. Operative arm to remain in sling at all times except RoM exercises and hygiene. Can perform pendulums, elbow/wrist/hand RoM exercises. Passive  RoM allowed to 90 FF and 30 ER.  Resume baseline Xarelto on postoperative day #1. Patient to return to clinic in ~2 weeks for post-operative appointment.

## 2021-05-05 NOTE — Transfer of Care (Signed)
Immediate Anesthesia Transfer of Care Note  Patient: Jennifer [name]  Procedure(s) Performed: Left reverse shoulder arthroplasty, biceps tenodesis (Left: Shoulder)  Patient Location: PACU  Anesthesia Type:General  Level of Consciousness: awake  Airway & Oxygen Therapy: Patient Spontanous Breathing  Post-op Assessment: Report given to RN and Post -op Vital signs reviewed and stable  Post vital signs: Reviewed, stable  Last Vitals:  Vitals Value Taken Time  BP 99/48 05/05/21 1440  Temp 36.8 C 05/05/21 1440  Pulse 62 05/05/21 1445  Resp 14 05/05/21 1445  SpO2 98 % 05/05/21 1445  Vitals shown include unvalidated device data.  Last Pain:  Vitals:   05/05/21 1440  TempSrc: Temporal  PainSc:          Complications: No notable events documented.

## 2021-05-05 NOTE — Anesthesia Procedure Notes (Signed)
Procedure Name: Intubation Date/Time: 05/05/2021 11:59 AM Performed by: Cheral Bay, CRNA Pre-anesthesia Checklist: Patient identified, Emergency Drugs available, Suction available and Patient being monitored Patient Re-evaluated:Patient Re-evaluated prior to induction Oxygen Delivery Method: Circle system utilized Preoxygenation: Pre-oxygenation with 100% oxygen Induction Type: IV induction Ventilation: Mask ventilation without difficulty Laryngoscope Size: McGraph and 3 Grade View: Grade I Tube type: Oral Number of attempts: 1 Airway Equipment and Method: Stylet and Oral airway Placement Confirmation: ETT inserted through vocal cords under direct vision, positive ETCO2 and breath sounds checked- equal and bilateral Tube secured with: Tape Dental Injury: Teeth and Oropharynx as per pre-operative assessment

## 2021-05-05 NOTE — Anesthesia Preprocedure Evaluation (Signed)
Anesthesia Evaluation  Patient identified by MRN, date of birth, ID band Patient awake  General Assessment Comment:  Patient last took her xarelto on Thursday (4 days prior). Patient very anxious about anesthesia risks given her age.  Reviewed: Allergy & Precautions, H&P , NPO status , Patient's Chart, lab work & pertinent test results  History of Anesthesia Complications Negative for: history of anesthetic complications  Airway Mallampati: II  TM Distance: >3 FB Neck ROM: full    Dental  (+) Poor Dentition, Chipped   Pulmonary neg pulmonary ROS, neg sleep apnea, neg COPD, Patient abstained from smoking.Not current smoker,  Patient has COPD listed on chart but patient denies.   Pulmonary exam normal breath sounds clear to auscultation       Cardiovascular Exercise Tolerance: Good METShypertension, On Medications (-) CAD and (-) Past MI Normal cardiovascular exam(-) dysrhythmias  Rhythm:regular Rate:Normal     Neuro/Psych Patient denies stroke or TIA. Hx of post-polio muscular atrophy.  Neuromuscular disease negative psych ROS   GI/Hepatic Neg liver ROS, GERD  Medicated and Controlled,  Endo/Other  diabetes  Renal/GU negative Renal ROS  negative genitourinary   Musculoskeletal  (+) Arthritis , Osteoarthritis,    Abdominal   Peds  Hematology negative hematology ROS (+)   Anesthesia Other Findings Past Medical History: No date: Arthritis No date: Atrial fibrillation (HCC)     Comment:  a.) CHA2DS2-VASc = 7 (age x 2, sex, HTN, TIA x 2, T2DM).              b.) rate/rhythm maintained on oral diltiazem; chronically              anticoagulated using rivaroxaban No date: B12 deficiency No date: Complication of anesthesia     Comment:  a.) delayed emergence following kyphoplasty No date: COPD (chronic obstructive pulmonary disease) (HCC) No date: DDD (degenerative disc disease), lumbar 03/02/2016: Diastolic  dysfunction     Comment:  a.)  TTE 03/02/2016: EF 65%; trivial MR; G1DD. b.)  TTE               11/20/2020: EF >55%; trivial MR, mild TR; G1DD. 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     Comment:  a.) LLE weakness; wears brace No date: T2DM (type 2 diabetes mellitus) (HCC) No date: TIA (transient ischemic attack) No date: Venous insufficiency of both lower extremities No date: Vitamin D deficiency  Reproductive/Obstetrics                             Anesthesia Physical  Anesthesia Plan  ASA: 3  Anesthesia Plan: General   Post-op Pain Management: Regional block* and Ofirmev IV (intra-op)*   Induction: Intravenous  PONV Risk Score and Plan: 3 and Ondansetron, Dexamethasone, Treatment may vary due to age or medical condition and Midazolam  Airway Management Planned: Oral ETT  Additional Equipment: None  Intra-op Plan:   Post-operative Plan: Extubation in OR  Informed Consent: I have reviewed the patients History and Physical, chart, labs and discussed the procedure including the risks, benefits and alternatives for the proposed anesthesia with the patient or authorized representative who has indicated his/her understanding and acceptance.     Dental advisory given  Plan Discussed with:  CRNA and Surgeon  Anesthesia Plan Comments: (Discussed risks of anesthesia with patient, including PONV, sore throat, lip/dental/eye damage, post operative cognitive dysfunction. Rare risks discussed as well, such as cardiorespiratory and neurological sequelae, and allergic reactions. Discussed the role of CRNA in patient's perioperative care. Patient understands. Discussed r/b/a of  interscalene block, including elective nature. Risks discussed: - Rare: bleeding, infection, nerve damage - shortness of breath from hemidiaphragmatic paralysis - unilateral horner's syndrome - poor/non-working blocks - reactions and toxicity to local anesthetic Patient understands and agrees. )        Anesthesia Quick Evaluation

## 2021-05-05 NOTE — Plan of Care (Signed)
°  Problem: Health Behavior/Discharge Planning: Goal: Ability to manage health-related needs will improve Outcome: Progressing   Problem: Clinical Measurements: Goal: Ability to maintain clinical measurements within normal limits will improve Outcome: Progressing Goal: Will remain free from infection Outcome: Progressing Goal: Diagnostic test results will improve Outcome: Progressing Goal: Respiratory complications will improve Outcome: Progressing   Problem: Activity: Goal: Risk for activity intolerance will decrease Outcome: Progressing   Problem: Nutrition: Goal: Adequate nutrition will be maintained Outcome: Progressing   Problem: Coping: Goal: Level of anxiety will decrease Outcome: Progressing   Problem: Pain Managment: Goal: General experience of comfort will improve Outcome: Progressing   Problem: Skin Integrity: Goal: Risk for impaired skin integrity will decrease Outcome: Progressing   Problem: Activity: Goal: Ability to avoid complications of mobility impairment will improve Outcome: Progressing Goal: Ability to tolerate increased activity will improve Outcome: Progressing   Problem: Education: Goal: Verbalization of understanding the information provided will improve Outcome: Progressing   Problem: Coping: Goal: Level of anxiety will decrease Outcome: Progressing   Problem: Physical Regulation: Goal: Postoperative complications will be avoided or minimized Outcome: Progressing   Problem: Respiratory: Goal: Ability to maintain a clear airway will improve Outcome: Progressing   Problem: Pain Management: Goal: Pain level will decrease Outcome: Progressing

## 2021-05-05 NOTE — Anesthesia Procedure Notes (Signed)
Anesthesia Regional Block: Interscalene brachial plexus block   Pre-Anesthetic Checklist: , timeout performed,  Correct Patient, Correct Site, Correct Laterality,  Correct Procedure, Correct Position, site marked,  Risks and benefits discussed,  Surgical consent,  Pre-op evaluation,  At surgeon's request and post-op pain management  Laterality: Left  Prep: chloraprep       Needles:  Injection technique: Single-shot  Needle Type: Echogenic Needle     Needle Length: 4cm  Needle Gauge: 25     Additional Needles:   Procedures:,,,, ultrasound used (permanent image in chart),,    Narrative:  Injection made incrementally with aspirations every 5 mL.  Performed by: Personally  Anesthesiologist: Beatris Belen, MD  Additional Notes: Patient's chart reviewed and they were deemed appropriate candidate for procedure, at surgeon's request. Patient educated about risks, benefits, and alternatives of the block including but not limited to: temporary or permanent nerve damage, bleeding, infection, damage to surround tissues, pneumothorax, hemidiaphragmatic paralysis, unilateral Horner's syndrome, block failure, local anesthetic toxicity. Patient expressed understanding. A formal time-out was conducted consistent with institution rules.  Monitors were applied, and minimal sedation used (see nursing record). The site was prepped with skin prep and allowed to dry, and sterile gloves were used. A high frequency linear ultrasound probe with probe cover was utilized throughout. C5-7 nerve roots located and appeared anatomically normal, local anesthetic injected around them, and echogenic block needle trajectory was monitored throughout. Aspiration performed every 5ml. Lung and blood vessels were avoided. All injections were performed without resistance and free of blood and paresthesias. The patient tolerated the procedure well.  Injectate: 10ml exparel + 10ml 0.5% bupivacaine      

## 2021-05-05 NOTE — Discharge Instructions (Signed)
Jennifer [name] H. Kyston Gonce, MD  [facility]  Phone: [phone]  Fax: [phone]   Discharge Instructions after Reverse Shoulder Replacement    1. Activity/Sling: You are to be non-weight bearing on operative extremity. A sling/shoulder immobilizer has been provided for you. Only remove the sling to perform elbow, wrist, and hand RoM exercises and hygiene/dressing. Active reaching and lifting are not permitted. You will be given further instructions on sling use at your first physical therapy visit and postoperative visit with Dr. Caryssa Elzey.   2. Dressings: Dressing may be removed at 1st physical therapy visit (~3-4 days after surgery). Afterwards, you may either leave open to air (if no drainage) or cover with dry, sterile dressing. If you have steri-strips on your wound, please do not remove them. They will fall off on their own. You may shower 5 days after surgery. Please pat incision dry. Do not rub or place any shear forces across incision. If there is drainage or any opening of incision after 5 days, please notify our offices immediately.    3. Driving:  Plan on not driving for six weeks. Please note that you are advised NOT to drive while taking narcotic pain medications as you may be impaired and unsafe to drive.   4. Medications:  - You have been provided a prescription for narcotic pain medicine (usually oxycodone). After surgery, take 1-2 narcotic tablets every 4 hours if needed for severe pain. Please start this as soon as you begin to start having pain (if you received a nerve block, start taking as soon as this wears off).  - A prescription for anti-nausea medication will be provided in case the narcotic medicine causes nausea - take 1 tablet every 6 hours only if nauseated.  - Take enteric coated aspirin 325 mg once daily for 6 weeks to prevent blood clots. Do not take aspirin if you have an aspirin sensitivity/allergy or asthma or are on an anticoagulant (blood thinner) already. If so, then  your home anticoagulant will be resume and managed - do not take aspirin. -Take tylenol 1000mg (2 Extra strength or 3 regular strength tablets) every 8 hours for pain. This will reduce the amount of narcotic medication needed. May stop tylenol when you are having minimal pain. - Take a stool softener (Colace, Dulcolax or Senakot) if you are using narcotic pain medications to help with constipation that is associated with narcotic use. - DO NOT take ANY nonsteroidal anti-inflammatory pain medications: Advil, Motrin, Ibuprofen, Aleve, Naproxen, or Naprosyn.   If you are taking prescription medication for anxiety, depression, insomnia, muscle spasm, chronic pain, or for attention deficit disorder you are advised that you are at a higher risk of adverse effects with use of narcotics post-op, including narcotic addiction/dependence, depressed breathing, death. If you use non-prescribed substances: alcohol, marijuana, cocaine, heroin, methamphetamines, etc., you are at a higher risk of adverse effects with use of narcotics post-op, including narcotic addiction/dependence, depressed breathing, death. You are advised that taking > 50 morphine milligram equivalents (MME) of narcotic pain medication per day results in twice the risk of overdose or death. For your prescription provided: oxycodone 5 mg - taking more than 6 tablets per day after the first few days of surgery.   5. Physical Therapy: 1-2 times per week for ~12 weeks. Therapy typically starts on post operative Day 3 or 4. You have been provided an order for physical therapy. The therapist will provide home exercises. Please contact our offices if this appointment has not been scheduled.      6. Work: May do light duty/desk job in approximately 2 weeks when off of narcotics, pain is well-controlled, and swelling has decreased if able to function with one arm in sling. Full work may take 6 weeks if light motions and function of both arms is required.  Lifting jobs may require 12 weeks.   7. Post-Op Appointments: Your first post-op appointment will be with Dr. Yannet [name] in approximately 2 weeks time.    If you find that they have not been scheduled please call the Orthopaedic Appointment front desk at [phone].                               Jennifer [name] H. Jennifer Bodin, MD [facility] Phone: [phone] Fax: [phone]   REVERSE SHOULDER ARTHROPLASTY REHAB GUIDELINES   These guidelines should be tailored to individual patients based on their rehab goals, age, precautions, quality of repair, etc.  Progression should be based on patient progress and approval by the referring physician.  PHASE 1 - Day 1 through Week 2  GENERAL GUIDELINES AND PRECAUTIONS Sling wear 24/7 except during grooming and home exercises (3 to 5 times daily) Avoid shoulder extension such that the arm is posterior the frontal plane.  When patients recline, a pillow should be placed behind the upper arm and sling should be on.  They should be advised to always be able to see the elbow Avoid combined IR/ADD/EXT, such as hand behind back to prevent dislocation Avoid combined IR and ADD such as reaching across the chest to prevent dislocation No AROM No submersion in pool/water for 4 weeks No weight bearing through operative arm (as in transfers, walker use, etc.)  GOALS Maintain integrity of joint replacement; protect soft tissue healing Increase PROM for elevation to 120 and ER to 30 (will remain the goal for first 6 weeks) Optimize distal UE circulation and muscle activity (elbow, wrist and hand) Instruct in use of sling for proper fit, polar care device for ice application after HEP, signs/symptoms of infection  EXERCISES Active elbow, wrist and hand Passive forward elevation in scapular plane to 90-120 max motion; ER in scapular plane to 30 Active scapular retraction with arms resting in neutral position  CRITERIA TO PROGRESS TO  PHASE 2 Low pain (less than 3/10) with shoulder PROM Healing of incision without signs of infection Clearance by MD to advance after 2 week MD check up  PHASE 2 - 2 weeks - 6 weeks  GENERAL GUIDELINES AND PRECAUTIONS Sling may be removed while at home; worn in community without abduction pillow May use arm for light activities of daily living (such as feeding, brushing teeth, dressing.) with elbow near  the side of the body  and arm in front of the body- no active lifting of the arm May submerge in water (tub, pool, Jacuzzi, etc.) after 4 weeks Continue to avoid WBing through the operative arm Continue to avoid combined IR/EXT/ADD (hand behind the back) and IR/ADD  (reaching across chest) for dislocation precautions  GOALS  Achieve passive elevation to 120 and ER to 30  Low (less than 3/10) to no pain  Ability to fire all heads of the deltoid  EXERCISES May discontinue grip, and active elbow and wrist exercises since using the arm in ADL's  with sling removed around the home Continue passive elevation to 120 and ER to 30, both in scapular plane with arm supported on table top Add submaximal isometrics, pain free effort, for all   functional heads of deltoid (anterior, posterior, middle)  Ensure that with posterior deltoid isometric the shoulder does not move into extension and the arm remains anterior the frontal plane At 4 weeks:  begin to place arm in balanced position of 90 deg elevation in supine; when patient able to hold this position with ease, may begin reverse pendulums clockwise and counterclockwise  CRITERIA TO PROGRESS TO PHASE 3 Passive forward elevation in scapular plane to 120; passive ER in scapular plane to 30 Ability to fire isometrically all heads of the deltoid muscle without pain Ability to place and hold the arm in balanced position (90 deg elevation in supine)  PHASE 3 - 6 weeks to 3 months  GENERAL GUIDELINES AND PRECAUTIONS Discontinue use of sling Avoid  forcing end range motion in any direction to prevent dislocation  May advance use of the arm actively in ADL's without being restricted to arm by the side of the body, however, avoid heavy lifting and sports (forever!) May initiate functional IR behind the back gently NO UPPER BODY ERGOMETER   GOALS Optimize PROM for elevation and ER in scapular plane with realistic expectation that max  mobility for elevation is usually around 145-160 passively; ER 40 to 50 passively; functional IR to L1 Recover AROM to approach as close to PROM available as possible; may expect 135-150 deg active elevation; 30 deg active ER; active functional IR to L1 Establish dynamic stability of the shoulder with deltoid and periscapular muscle gradual strengthening  EXERCISES Forward elevation in scapular plane active progression: supine to incline, to vertical; short to long lever arm Balanced position long lever arm AROM Active ER/IR with arm at side Scapular retraction with light band resistance Functional IR with hand slide up back - very gentle and gradual NO UPPER BODY ERGOMETER     CRITERIA TO PROGRESS TO PHASE 4  AROM equals/approaches PROM with good mechanics for elevation   No pain  Higher level demand on shoulder than ADL functions   PHASE 4 12 months and beyond  GENERAL GUIDELINES AND PRECAUTIONS No heavy lifting and no overhead sports No heavy pushing activity Gradually increase strength of deltoid and scapular stabilizers; also the rotator cuff if present with weights not to exceed 5 lbs NO UPPER BODY ERGOMETER   GOALS  Optimize functional use of the operative UE to meet the desired demands  Gradual increase in deltoid, scapular muscle, and rotator cuff strength  Pain free functional activities   EXERCISES Add light hand weights for deltoid up to and not to exceed 3 lbs for anterior and posterior with long arm lift against gravity; elbow bent to 90 deg for abduction in scapular  plane Theraband progression for extension to hip with scapular depression/retraction Theraband progression for serratus anterior punches in supine; avoid wall, incline or prone pressups for serratus anterior End range stretching gently without forceful overpressure in all planes (elevation in scapular plane, ER in scapular plane, functional IR) with stretching done for life as part of a daily routine NO UPPER BODY ERGOMETER     CRITERIA FOR DISCHARGE FROM SKILLED PHYSICAL THERAPY  Pain free AROM for shoulder elevation (expect around 135-150)  Functional strength for all ADL's, work tasks, and hobbies approved by surgeon  Independence with home maintenance program   NOTES: 1. With proper exercise, motion, strength, and function continue to improve even after one year. 2. The complication rate after surgery is 5 - 8%. Complications include infection, fracture, heterotopic bone formation, nerve injury, instability, rotator cuff   tear, and tuberosity nonunion. Please look for clinical signs, unusual symptoms, or lack of progress with therapy and report those to Dr. Jahmia Berrett. Prefer more communication than less.  3. The therapy plan above only serves as a guide. Please be aware of specific individualized patient instructions as written on the prescription or through discussions with the surgeon. 4. Please call Dr. Jacey Pelc if you have any specific questions or concerns [phone]    

## 2021-05-06 ENCOUNTER — Encounter: Payer: Self-pay | Admitting: Orthopedic Surgery

## 2021-05-06 LAB — CBC
HCT: 36.7 % (ref 36.0–46.0)
Hemoglobin: 11.6 g/dL — ABNORMAL LOW (ref 12.0–15.0)
MCH: 30.3 pg (ref 26.0–34.0)
MCHC: 31.6 g/dL (ref 30.0–36.0)
MCV: 95.8 fL (ref 80.0–100.0)
Platelets: 251 10*3/uL (ref 150–400)
RBC: 3.83 MIL/uL — ABNORMAL LOW (ref 3.87–5.11)
RDW: 14.2 % (ref 11.5–15.5)
WBC: 8.2 10*3/uL (ref 4.0–10.5)
nRBC: 0 % (ref 0.0–0.2)

## 2021-05-06 LAB — BASIC METABOLIC PANEL
Anion gap: 9 (ref 5–15)
BUN: 14 mg/dL (ref 8–23)
CO2: 23 mmol/L (ref 22–32)
Calcium: 9 mg/dL (ref 8.9–10.3)
Chloride: 105 mmol/L (ref 98–111)
Creatinine, Ser: 0.66 mg/dL (ref 0.44–1.00)
GFR, Estimated: >60 mL/min (ref >60)
Glucose, Bld: 308 mg/dL — ABNORMAL HIGH (ref 70–99)
Potassium: 4.4 mmol/L (ref 3.5–5.1)
Sodium: 137 mmol/L (ref 135–145)

## 2021-05-06 MED ORDER — ACETAMINOPHEN 500 MG PO TABS
ORAL_TABLET | ORAL | Status: AC
  Filled 2021-05-06: qty 2

## 2021-05-06 MED ORDER — DOCUSATE SODIUM 100 MG PO CAPS
ORAL_CAPSULE | ORAL | Status: AC
  Administered 2021-05-06: 100 mg via ORAL
  Filled 2021-05-06: qty 1

## 2021-05-06 MED ORDER — CEFAZOLIN SODIUM-DEXTROSE 2-4 GM/100ML-% IV SOLN
INTRAVENOUS | Status: AC
  Filled 2021-05-06: qty 100

## 2021-05-06 MED ORDER — PANTOPRAZOLE SODIUM 40 MG PO TBEC
DELAYED_RELEASE_TABLET | ORAL | Status: AC
  Filled 2021-05-06: qty 1

## 2021-05-06 MED ORDER — ENSURE ENLIVE PO LIQD
237.0000 mL | Freq: Two times a day (BID) | ORAL | Status: DC
  Administered 2021-05-06 – 2021-05-07 (×2): 237 mL via ORAL

## 2021-05-06 MED ORDER — ADULT MULTIVITAMIN W/MINERALS CH
1.0000 | ORAL_TABLET | Freq: Every day | ORAL | Status: DC
  Administered 2021-05-07: 1 via ORAL
  Filled 2021-05-06: qty 1

## 2021-05-06 NOTE — Anesthesia Postprocedure Evaluation (Signed)
Anesthesia Post Note  Patient: Garment/textile technologist  Procedure(s) Performed: Left reverse shoulder arthroplasty, biceps tenodesis (Left: Shoulder)  Patient location during evaluation: PACU Anesthesia Type: General Level of consciousness: awake and alert Pain management: pain level controlled Vital Signs Assessment: post-procedure vital signs reviewed and stable Respiratory status: spontaneous breathing, nonlabored ventilation and respiratory function stable Cardiovascular status: blood pressure returned to baseline and stable Postop Assessment: no apparent nausea or vomiting Anesthetic complications: no   No notable events documented.   Last Vitals:  Vitals:   05/06/21 0000 05/06/21 0400  BP: (!) 133/46 121/64  Pulse: 95 88  Resp: 17 17  Temp: (!) 36.1 C (!) 36 C  SpO2: 97% 94%    Last Pain:  Vitals:   05/06/21 0400  TempSrc: Temporal  PainSc:                  [place] Ouch

## 2021-05-06 NOTE — Progress Notes (Signed)
Physical Therapy Treatment Patient Details Name: Jennifer [name] MRN: [id number] DOB: April 03, 1931 Today's Date: 05/06/2021   History of Present Illness 88 yo Female admitted for L reverse total shoulder on 05/05/21.  PMH: HTN, OA, hx of post-polio muscular atrophy.    PT Comments    Pt was able to ambulate with QC showing much more confidence than this AM, but still voicing how she is so much more confident with the walker - reiterated that she will not be able to use L UE on the walker for a while and will need to practice with single Ue ADs for now.  Pt with good attitude, though continues to voice anxiety and show general lack of awareness for all issues regarding post total shoulder recovery and rehab.   Recommendations for follow up therapy are one component of a multi-disciplinary discharge planning process, led by the attending physician.  Recommendations may be updated based on patient status, additional functional criteria and insurance authorization.  Follow Up Recommendations  Skilled nursing-short term rehab (<3 hours/day)     Assistance Recommended at Discharge Intermittent Supervision/Assistance  Patient can return home with the following A lot of help with walking and/or transfers;A lot of help with bathing/dressing/bathroom;Assistance with cooking/housework;Assist for transportation   Equipment Recommendations       Recommendations for Other Services       Precautions / Restrictions Precautions Precautions: Fall;Shoulder Shoulder Interventions: Shoulder sling/immobilizer;Shoulder abduction pillow;Off for dressing/bathing/exercises Restrictions LUE Weight Bearing: Non weight bearing     Mobility  Bed Mobility Overal bed mobility: Needs Assistance Bed Mobility: Supine to Sit     Supine to sit: Min assist          Transfers Overall transfer level: Needs assistance Equipment used: Quad cane Transfers: Sit to/from Stand Sit to Stand: Mod assist            General transfer comment: Simialr to this AM pt unable to rise from standard height bed and needed relatively heavy assist even with plenty of cuing for set up and sequencing. For next attempt PT raised bed ~2" and pt was able to rise with only min assist.    Ambulation/Gait Ambulation/Gait assistance: Min assist Gait Distance (Feet): 70 Feet Assistive device: Quad cane         General Gait Details: Again pt showed good effot but needed constant cuing and encouragement during the effort and though there was some small scale instability she had no LOBS.  Pt more confident this afternoon but still c/o issues with "my crippled foot" and voices having much more confidence with walker but PT reiterates that she will not be able to effectively use walker for a while.   Stairs             Wheelchair Mobility    Modified Rankin (Stroke Patients Only)       Balance Overall balance assessment: Needs assistance Sitting-balance support: Feet supported, Single extremity supported Sitting balance-Leahy Scale: Good     Standing balance support: Single extremity supported Standing balance-Leahy Scale: Fair Standing balance comment: better coordination with cane this afternoon, still guarded and cautious with upright activity                            Cognition Arousal/Alertness: Awake/alert Behavior During Therapy: WFL for tasks assessed/performed Overall Cognitive Status: Within Functional Limits for tasks assessed  Exercises General Exercises - Lower Extremity Long Arc Quad: Strengthening, 10 reps Heel Slides: Strengthening, 10 reps Hip ABduction/ADduction: Strengthening, 10 reps Hip Flexion/Marching: Strengthening, 10 reps Other Exercises Other Exercises: discussed and re-educated on hand and wrist exercises as well as her precautions/limitations post-op, though, despite repeated explanations, this  did not seem to be fully integrated with her expectations/worry    General Comments        Pertinent Vitals/Pain Pain Assessment Pain Assessment: 0-10 Pain Score: 4  Pain Location: starting to get some L axiallary pain    Home Living                          Prior Function            PT Goals (current goals can now be found in the care plan section) Progress towards PT goals: Progressing toward goals    Frequency    BID      PT Plan Current plan remains appropriate    Co-evaluation              AM-PAC PT "6 Clicks" Mobility   Outcome Measure  Help needed turning from your back to your side while in a flat bed without using bedrails?: A Little Help needed moving from lying on your back to sitting on the side of a flat bed without using bedrails?: A Little Help needed moving to and from a bed to a chair (including a wheelchair)?: A Lot Help needed standing up from a chair using your arms (e.g., wheelchair or bedside chair)?: A Lot Help needed to walk in hospital room?: A Lot Help needed climbing 3-5 steps with a railing? : A Lot 6 Click Score: 14    End of Session Equipment Utilized During Treatment: Gait belt Activity Tolerance: Patient tolerated treatment well Patient left: in chair;with call bell/phone within reach Nurse Communication: Mobility status PT Visit Diagnosis: Muscle weakness (generalized) (M62.81);Difficulty in walking, not elsewhere classified (R26.2);Unsteadiness on feet (R26.81)     Time: 3762-8315 PT Time Calculation (min) (ACUTE ONLY): 29 min  Charges:  $Gait Training: 8-22 mins $Therapeutic Exercise: 8-22 mins $Therapeutic Activity: 8-22 mins                     Malachi Pro, DPT 05/06/2021, 3:41 PM

## 2021-05-06 NOTE — TOC Progression Note (Signed)
Transition of Care [facility]) - Progression Note    Patient Details  Name: Jennifer [name] MRN: [id number] Date of Birth: 1931/05/30  [facility] CM/SW Contact  Marlowe Sax, RN Phone Number: 05/06/2021, 10:47 AM  Clinical Narrative:   [facility] will offer if the patient is fully vaccinated, the patient stated that she had 2 vaccines and no Booster and does not want to take the booster, I notified Sue Lush at [facility]         Expected Discharge Plan and Services                                                 Social Determinants of Health (SDOH) Interventions    Readmission Risk Interventions No flowsheet data found.

## 2021-05-06 NOTE — Progress Notes (Signed)
°  Subjective: 1 Day Post-Op Procedure(s) (LRB): Left reverse shoulder arthroplasty, biceps tenodesis (Left) Patient reports pain as mild.   Patient is well, and has had no acute complaints or problems Plan is to go Home after hospital stay. Negative for chest pain and shortness of breath Fever: no Gastrointestinal: Negative for nausea and vomiting  Objective: Vital signs in last 24 hours: Temp:  [96.8 F (36 C)-98.2 F (36.8 C)] 96.8 F (36 C) (02/21 0400) Pulse Rate:  [56-99] 88 (02/21 0400) Resp:  [14-116] 17 (02/21 0400) BP: (86-146)/(43-68) 121/64 (02/21 0400) SpO2:  [93 %-100 %] 94 % (02/21 0400) Weight:  [58.1 kg] 58.1 kg (02/20 0935)  Intake/Output from previous day:  Intake/Output Summary (Last 24 hours) at 05/06/2021 0735 Last data filed at 05/06/2021 0400 Gross per 24 hour  Intake 3020.34 ml  Output 1845 ml  Net 1175.34 ml    Intake/Output this shift: No intake/output data recorded.  Labs: Recent Labs    05/06/21 0540  HGB 11.6*   Recent Labs    05/06/21 0540  WBC 8.2  RBC 3.83*  HCT 36.7  PLT 251   Recent Labs    05/06/21 0540  NA 137  K 4.4  CL 105  CO2 23  BUN 14  CREATININE 0.66  GLUCOSE 308*  CALCIUM 9.0   No results for input(s): LABPT, INR in the last 72 hours.   EXAM General - Patient is Alert and Oriented Extremity - Neurovascular intact Sensation intact distally Dorsiflexion/Plantar flexion intact Dressing/Incision - clean, dry, with a Hemovac drain removed Motor Function - intact, moving fingers and wrist well on exam.  Able to give posterior pressure for deltoid function.  Past Medical History:  Diagnosis Date   Arthritis    Atrial fibrillation (HCC)    a.) CHA2DS2-VASc = 7 (age x 2, sex, HTN, TIA x 2, T2DM). b.) rate/rhythm maintained on oral diltiazem; chronically anticoagulated using rivaroxaban   B12 deficiency    Complication of anesthesia    a.) delayed emergence following kyphoplasty   COPD (chronic obstructive  pulmonary disease) (HCC)    DDD (degenerative disc disease), lumbar    Diastolic dysfunction 03/02/2016   a.)  TTE 03/02/2016: EF 65%; trivial MR; G1DD. b.)  TTE 11/20/2020: EF >55%; trivial MR, mild TR; G1DD.   Diverticulosis    GERD (gastroesophageal reflux disease)    History of kidney stones    HLD (hyperlipidemia)    Hypertension    Long term current use of anticoagulant    a.) Rivaroxaban   Post-menopausal osteoporosis    Post-poliomyelitis muscular atrophy    a.) LLE weakness; wears brace   T2DM (type 2 diabetes mellitus) (HCC)    TIA (transient ischemic attack)    Venous insufficiency of both lower extremities    Vitamin D deficiency     Assessment/Plan: 1 Day Post-Op Procedure(s) (LRB): Left reverse shoulder arthroplasty, biceps tenodesis (Left) Principal Problem:   S/p reverse total shoulder arthroplasty  Estimated body mass index is 25.85 kg/m as calculated from the following:   Height as of this encounter: 4\' 11"  (1.499 m).   Weight as of this encounter: 58.1 kg. Advance diet Up with therapy D/C IV fluids Plan to discharge home tomorrow after physical therapy.  DVT Prophylaxis - Xarelto Shoulder immobilizer to left arm at all times, except bathing and physical therapy.  , PA-C Orthopaedic Surgery 05/06/2021, 7:35 AM

## 2021-05-06 NOTE — Progress Notes (Signed)
Inpatient Diabetes Program Recommendations  AACE/ADA: New Consensus Statement on Inpatient Glycemic Control (2015)  Target Ranges:  Prepandial:   less than 140 mg/dL      Peak postprandial:   less than 180 mg/dL (1-2 hours)      Critically ill patients:  140 - 180 mg/dL   Lab Results  Component Value Date   GLUCAP 136 (H) 05/05/2021   HGBA1C 6.6 (H) 03/02/2016    Review of Glycemic Control  Latest Reference Range & Units 05/06/21 05:40  Glucose 70 - 99 mg/dL 540 (H)  (H): Data is abnormally high Diabetes history: Type 2 Dm Outpatient Diabetes medications: none Current orders for Inpatient glycemic control: none Decadron 10 mg x 1  Inpatient Diabetes Program Recommendations:    If appropriate, consider changing to carb modified diet and adding Novolog 0-6 units TID.  Of note, patient has not had A1C repeated since 2017.   Thanks, Lujean Rave, MSN, RNC-OB Diabetes Coordinator [phone] (8a-5p)

## 2021-05-06 NOTE — Evaluation (Signed)
Occupational Therapy Evaluation Patient Details Name: Jennifer [name] MRN: [id number] DOB: [birth year] Today's Date: 05/06/2021   History of Present Illness Pt is 88 yo Female admitted for L reverse total shoulder on 05/05/21.  PMH: HTN, OA, hx of post-polio muscular atrophy.   Clinical Impression   Jennifer [name] was seen for OT evaluation this date. Prior to hospital admission, pt was MOD I for mobility using RW and AFO. Pt lives alone in home c level entry. Pt presents to acute OT demonstrating impaired ADL performance and functional mobility 2/2 decreased activity tolerance, poor safety awareness, and functional strength/ROM/balance deficits. Pt currently requires MIN A sup>sit. MAX A don B shoes and L AFO seated EOB. MAX A don sling seated EOB. MIN A + R rail use sit<>stand - pt reports anxiousness re: mobility. SETUP self-feeding sitting EOB, left in sitting with good balance. Pt would benefit from skilled OT to address noted impairments and functional limitations (see below for any additional details). Upon hospital discharge, recommend STR to maximize pt safety and return to PLOF. Pt will require 24/7 SUPERVISION to d/c home safely given current deficits. Will continue to assess.      Recommendations for follow up therapy are one component of a multi-disciplinary discharge planning process, led by the attending physician.  Recommendations may be updated based on patient status, additional functional criteria and insurance authorization.   Follow Up Recommendations  Skilled nursing-short term rehab (<3 hours/day)    Assistance Recommended at Discharge Frequent or constant Supervision/Assistance  Patient can return home with the following A lot of help with walking and/or transfers;A lot of help with bathing/dressing/bathroom;Assistance with cooking/housework;Help with stairs or ramp for entrance    Functional Status Assessment  Patient has had a recent decline in their functional status and  demonstrates the ability to make significant improvements in function in a reasonable and predictable amount of time.  Equipment Recommendations  BSC/3in1    Recommendations for Other Services       Precautions / Restrictions Precautions Precautions: Fall;Shoulder Shoulder Interventions: Shoulder sling/immobilizer;Shoulder abduction pillow;Off for dressing/bathing/exercises Restrictions Weight Bearing Restrictions: Yes LUE Weight Bearing: Non weight bearing      Mobility Bed Mobility Overal bed mobility: Needs Assistance Bed Mobility: Supine to Sit     Supine to sit: Min assist          Transfers Overall transfer level: Needs assistance Equipment used: 1 person hand held assist Transfers: Sit to/from Stand Sit to Stand: Min assist                  Balance Overall balance assessment: Needs assistance Sitting-balance support: Feet supported, Single extremity supported Sitting balance-Leahy Scale: Good     Standing balance support: Single extremity supported Standing balance-Leahy Scale: Poor                             ADL either performed or assessed with clinical judgement   ADL Overall ADL's : Needs assistance/impaired                                       General ADL Comments: MAX A don B shoes and L AFO seated EOB. MAX A don sling seated EOB. MIN A + R rail use for ADL t/f - pt reports anxiousness re: mobility. SETUP self-feeding sitting EOB.      Pertinent Vitals/Pain  Pain Assessment Pain Assessment: No/denies pain     Hand Dominance     Extremity/Trunk Assessment Upper Extremity Assessment Upper Extremity Assessment: LUE deficits/detail LUE: Unable to fully assess due to immobilization   Lower Extremity Assessment Lower Extremity Assessment: Generalized weakness       Communication Communication Communication: No difficulties   Cognition Arousal/Alertness: Awake/alert Behavior During Therapy: WFL for  tasks assessed/performed Overall Cognitive Status: Within Functional Limits for tasks assessed                                        Home Living Family/patient expects to be discharged to:: Private residence Living Arrangements: Alone   Type of Home: House Home Access: Level entry     Home Layout: One level               Home Equipment: Agricultural consultant (2 wheels)   Additional Comments: is working on getting more in home assistance. plans to sleep in lift chair      Prior Functioning/Environment Prior Level of Function : Needs assist             Mobility Comments: RW and AFO for mobility          OT Problem List: Decreased strength;Decreased range of motion;Decreased activity tolerance;Impaired balance (sitting and/or standing);Decreased safety awareness;Impaired UE functional use      OT Treatment/Interventions: Self-care/ADL training;Therapeutic exercise;Energy conservation;DME and/or AE instruction;Therapeutic activities;Patient/family education;Balance training    OT Goals(Current goals can be found in the care plan section) Acute Rehab OT Goals Patient Stated Goal: to return to PLOF OT Goal Formulation: With patient Time For Goal Achievement: 05/20/21 Potential to Achieve Goals: Good  OT Frequency: Min 3X/week    Co-evaluation              AM-PAC OT "6 Clicks" Daily Activity     Outcome Measure Help from another person eating meals?: A Little Help from another person taking care of personal grooming?: A Little Help from another person toileting, which includes using toliet, bedpan, or urinal?: A Lot Help from another person bathing (including washing, rinsing, drying)?: A Lot Help from another person to put on and taking off regular upper body clothing?: A Lot Help from another person to put on and taking off regular lower body clothing?: A Lot 6 Click Score: 14   End of Session    Activity Tolerance: Patient tolerated  treatment well Patient left: in bed;with call bell/phone within reach  OT Visit Diagnosis: Muscle weakness (generalized) (M62.81);Other abnormalities of gait and mobility (R26.89)                Time: 5277-8242 OT Time Calculation (min): 15 min Charges:  OT General Charges $OT Visit: 1 Visit OT Evaluation $OT Eval Low Complexity: 1 Low  Kathie Dike, M.S. OTR/L  05/06/21, 10:13 AM  ascom [phone]

## 2021-05-06 NOTE — TOC Progression Note (Signed)
Transition of Care [facility]) - Progression Note    Patient Details  Name: Jennifer [name] MRN: [id number] Date of Birth: 1931/07/10  Transition [facility]) CM/SW Contact  Marlowe Sax, RN Phone Number: 05/06/2021, 3:28 PM  Clinical Narrative:   Reviewed the bed offers with the patient, [facility] out the of the options, I notified [facility]           Expected Discharge Plan and Services                                                 Social Determinants of Health (SDOH) Interventions    Readmission Risk Interventions No flowsheet data found.

## 2021-05-06 NOTE — [place] FL2 (Signed)
[place] MEDICAID FL2 LEVEL OF CARE SCREENING TOOL     IDENTIFICATION  Patient Name: Jennifer [name] Birthdate: January 13, 1932 Sex: female Admission Date (Current Location): 05/05/2021  [facility] and [place] Number:  Chiropodist and Address:  [facility], [address], [place], [place] [address]      Provider Number: [id number]  Attending Physician Name and Address:  Signa Kell, MD  Relative Name and Phone Number:  [name] [phone]    Current Level of Care: Hospital Recommended Level of Care: Skilled Nursing Facility Prior Approval Number:    Date Approved/Denied:   PASRR Number: [id number]  Discharge Plan: SNF    Current Diagnoses: Patient Active Problem List   Diagnosis Date Noted   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/05/2021   Nausea vomiting and diarrhea    Hypokalemia    Acute gastroenteritis    Atrial fibrillation with RVR (HCC) 06/17/2020   A-fib (HCC) 03/01/2016    Orientation RESPIRATION BLADDER Height & Weight     Self, Time, Situation, Place  Normal Continent Weight: 58.1 kg Height:  4\' 11"  (149.9 cm)  BEHAVIORAL SYMPTOMS/MOOD NEUROLOGICAL BOWEL NUTRITION STATUS      Continent Diet (regular)  AMBULATORY STATUS COMMUNICATION OF NEEDS Skin   Extensive Assist Verbally Normal, Surgical wounds                       Personal Care Assistance Level of Assistance  Bathing, Feeding, Dressing Bathing Assistance: Limited assistance Feeding assistance: Independent Dressing Assistance: Limited assistance     Functional Limitations Info             SPECIAL CARE FACTORS FREQUENCY  PT (By licensed PT), OT (By licensed OT)     PT Frequency: 5 times per week OT Frequency: 5 times per week            Contractures Contractures Info: Not present    Additional Factors Info  Code Status, Allergies Code Status Info: full code Allergies Info: Esomeprazole Magnesium, Nitrofurantoin            Current Medications (05/06/2021):  This is the current hospital active medication list Current Facility-Administered Medications  Medication Dose Route Frequency Provider Last Rate Last Admin   0.9 %  sodium chloride infusion   Intravenous Continuous 05/08/2021, MD 10 mL/hr at 05/05/21 2356 Rate Change at 05/05/21 2356   acetaminophen (TYLENOL) tablet 1,000 mg  1,000 mg Oral Q8H 05/07/21, MD   1,000 mg at 05/06/21 0658   alum & mag hydroxide-simeth (MAALOX/MYLANTA) 200-200-20 MG/5ML suspension 30 mL  30 mL Oral Q4H PRN 09-22-2000, MD       bisacodyl (DULCOLAX) suppository 10 mg  10 mg Rectal Daily PRN Signa Kell, MD       ceFAZolin (ANCEF) IVPB 2g/100 mL premix  2 g Intravenous Signa Kell, MD   Stopped at 05/06/21 0537   diltiazem (CARDIZEM CD) 24 hr capsule 240 mg  240 mg Oral 05/08/21, Shirleen Schirmer, MD   240 mg at 05/06/21 05/08/21   docusate sodium (COLACE) capsule 100 mg  100 mg Oral BID 3559, MD   100 mg at 05/06/21 05/08/21   HYDROmorphone (DILAUDID) injection 0.2-0.4 mg  0.2-0.4 mg Intravenous Q4H PRN 7416, MD       lisinopril (ZESTRIL) tablet 40 mg  40 mg Oral Signa Kell, Shirleen Schirmer, MD   40 mg at 05/06/21 05/08/21   menthol-cetylpyridinium (CEPACOL) lozenge 3 mg  1 lozenge Oral  PRN Signa Kell, MD       Or   phenol [facility]) mouth spray 1 spray  1 spray Mouth/Throat PRN Signa Kell, MD       metoCLOPramide (REGLAN) tablet 5-10 mg  5-10 mg Oral Q8H PRN Signa Kell, MD       Or   metoCLOPramide (REGLAN) injection 5-10 mg  5-10 mg Intravenous Q8H PRN Signa Kell, MD       ondansetron [facility]) tablet 4 mg  4 mg Oral Q6H PRN Signa Kell, MD       Or   ondansetron [facility]) injection 4 mg  4 mg Intravenous Q6H PRN Signa Kell, MD       oxyCODONE (Oxy IR/ROXICODONE) immediate release tablet 2.5-5 mg  2.5-5 mg Oral Q4H PRN Signa Kell, MD       oxyCODONE (Oxy IR/ROXICODONE) immediate release tablet 5-10 mg  5-10 mg Oral Q4H PRN Signa Kell, MD       pantoprazole  (PROTONIX) EC tablet 40 mg  40 mg Oral Daily Signa Kell, MD       rivaroxaban Carlena Hurl) tablet 20 mg  20 mg Oral Q supper Signa Kell, MD       senna-docusate (Senokot-S) tablet 1 tablet  1 tablet Oral QHS PRN Signa Kell, MD   1 tablet at 05/06/21 4401   simvastatin (ZOCOR) tablet 10 mg  10 mg Oral Deirdre Evener, MD       Melene Muller ON 05/09/2021] Vitamin D (Ergocalciferol) (DRISDOL) capsule 50,000 Units  50,000 Units Oral Q14 Days Signa Kell, MD         Discharge Medications: Please see discharge summary for a list of discharge medications.  Relevant Imaging Results:  Relevant Lab Results:   Additional Information SSN [id number]  Jennifer Sax, RN

## 2021-05-06 NOTE — Progress Notes (Signed)
Physical Therapy Treatment Patient Details Name: Jennifer [name] MRN: [id number] DOB: [birth year] Today's Date: 05/06/2021   History of Present Illness 88 yo Female admitted for L reverse total shoulder on 05/05/21.  PMH: HTN, OA, hx of post-polio muscular atrophy.    PT Comments    Pt initially very anxious about trying to get up with anything other than a walker, despite repeated cuing and explanation she was hesitant with the idea of using anything other than her walker and struggled to understand that she will not be able to put weight through L UE for a while - ultimately did comprehend and agree to try walking with QC.  Pt with plenty of questions (and anxiety) regarding her limitations and ability to manage at home but endorses that she has unsteadiness even with walker, has recently struggled with most ADLs, opening doors, etc and though she could have some regular supervision would not have enough to safely transition home given her precautions moving forward.  Pt needed plenty of cuing and reinforcement t/o the session.  Recommendations for follow up therapy are one component of a multi-disciplinary discharge planning process, led by the attending physician.  Recommendations may be updated based on patient status, additional functional criteria and insurance authorization.  Follow Up Recommendations  Skilled nursing-short term rehab (<3 hours/day)     Assistance Recommended at Discharge Intermittent Supervision/Assistance  Patient can return home with the following A lot of help with walking and/or transfers;A lot of help with bathing/dressing/bathroom;Assistance with cooking/housework;Assist for transportation   Equipment Recommendations       Recommendations for Other Services       Precautions / Restrictions Precautions Precautions: Fall;Shoulder Shoulder Interventions: Shoulder sling/immobilizer;Shoulder abduction pillow;Off for  dressing/bathing/exercises Restrictions Weight Bearing Restrictions: Yes LUE Weight Bearing: Non weight bearing     Mobility  Bed Mobility Overal bed mobility: Needs Assistance Bed Mobility: Supine to Sit     Supine to sit: Min assist          Transfers Overall transfer level: Needs assistance Equipment used: Quad cane Transfers: Sit to/from Stand Sit to Stand: Mod assist           General transfer comment: Pt unable to rise from standard height bed and needed relatively heavy assist even with plenty of cuing for set up and sequencing. For next attempt PT raised bed ~2" and pt was able to rise with only min assist.    Ambulation/Gait Ambulation/Gait assistance: Min assist Gait Distance (Feet): 35 Feet Assistive device: Quad cane         General Gait Details: Pt was able to ambulate slowly and with essentially constant cuing, encouragement and reinfocement.  She fatigued with the effort but despite being very guarded did not have any LOBs and was able to manage the modest amount of activity.  Despite a lot of education and re-education pt struggled to comprehend that she will not be able to use the L UE (on RW, etc) for a while, she ultimately agreed to use the QC and fortunately did show relative competancy with it.   Stairs             Wheelchair Mobility    Modified Rankin (Stroke Patients Only)       Balance Overall balance assessment: Needs assistance Sitting-balance support: Feet supported, Single extremity supported Sitting balance-Leahy Scale: Good     Standing balance support: Single extremity supported Standing balance-Leahy Scale: Poor Standing balance comment: Once upright  Cognition Arousal/Alertness: Awake/alert Behavior During Therapy: WFL for tasks assessed/performed Overall Cognitive Status: Within Functional Limits for tasks assessed                                           Exercises Other Exercises Other Exercises: discussed and re-educated on hand and wrist exercises as well as her precautions/limitations post-op, though, despite repeated explanations, this did not seem to be fully integrated with her expectations/worry    General Comments        Pertinent Vitals/Pain Pain Assessment Pain Assessment: No/denies pain    Home Living Family/patient expects to be discharged to:: Private residence Living Arrangements: Alone   Type of Home: House Home Access: Level entry       Home Layout: One level Home Equipment: Agricultural consultant (2 wheels) Additional Comments: is working on getting more in home assistance. plans to sleep in lift chair    Prior Function            PT Goals (current goals can now be found in the care plan section) Progress towards PT goals: Progressing toward goals    Frequency    BID      PT Plan Current plan remains appropriate    Co-evaluation              AM-PAC PT "6 Clicks" Mobility   Outcome Measure  Help needed turning from your back to your side while in a flat bed without using bedrails?: A Little Help needed moving from lying on your back to sitting on the side of a flat bed without using bedrails?: A Little Help needed moving to and from a bed to a chair (including a wheelchair)?: A Lot Help needed standing up from a chair using your arms (e.g., wheelchair or bedside chair)?: A Lot Help needed to walk in hospital room?: A Lot Help needed climbing 3-5 steps with a railing? : A Lot 6 Click Score: 14    End of Session Equipment Utilized During Treatment: Gait belt Activity Tolerance: Patient tolerated treatment well Patient left: in chair;with call bell/phone within reach Nurse Communication: Mobility status PT Visit Diagnosis: Muscle weakness (generalized) (M62.81);Difficulty in walking, not elsewhere classified (R26.2);Unsteadiness on feet (R26.81)     Time: 2409-7353 PT Time Calculation (min)  (ACUTE ONLY): 35 min  Charges:  $Gait Training: 8-22 mins $Therapeutic Activity: 8-22 mins                     Malachi Pro, DPT 05/06/2021, 12:40 PM

## 2021-05-06 NOTE — TOC Initial Note (Signed)
Transition of Care [facility]) - Initial/Assessment Note    Patient Details  Name: Jennifer [name] MRN: [id number] Date of Birth: 01-Jul-1931  Transition [facility]) CM/SW Contact:    Marlowe Sax, RN Phone Number: 05/06/2021, 9:34 AM  Clinical Narrative:        It is recommended that the patient go to STR SNF, PASSR obtained , FL2 completed, Bedsearch sent Will review the bed offers once abtained                 Patient Goals and CMS Choice        Expected Discharge Plan and Services                                                Prior Living Arrangements/Services                       Activities of Daily Living Home Assistive Devices/Equipment: Other (Comment) (orthopetic shoes) ADL Screening (condition at time of admission) Patient's cognitive ability adequate to safely complete daily activities?: Yes Is the patient deaf or have difficulty hearing?: No Does the patient have difficulty seeing, even when wearing glasses/contacts?: No Does the patient have difficulty concentrating, remembering, or making decisions?: No Patient able to express need for assistance with ADLs?: Yes Does the patient have difficulty dressing or bathing?: Yes Independently performs ADLs?: No Does the patient have difficulty walking or climbing stairs?: Yes Weakness of Legs: Both Weakness of Arms/Hands: Left  Permission Sought/Granted                  Emotional Assessment              Admission diagnosis:  S/p reverse total shoulder arthroplasty [Z96.619] Patient Active Problem List   Diagnosis Date Noted   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/05/2021   Nausea vomiting and diarrhea    Hypokalemia    Acute gastroenteritis    Atrial fibrillation with RVR (HCC) 06/17/2020   A-fib (HCC) 03/01/2016   PCP:  Marina Goodell, MD Pharmacy:   [facility] [phone] [place], [place] - [address] [place]  [address] Phone: [phone] Fax: [phone]  [facility] - [place], [place] - [address] [place] [address] Phone: [phone] Fax: [phone]  [facility] [place] [address], [place] - [address] AT [facility] [address] [place] [address] Phone: [phone] Fax: [phone]     Social Determinants of Health (SDOH) Interventions    Readmission Risk Interventions No flowsheet data found.

## 2021-05-06 NOTE — Progress Notes (Signed)
Initial Nutrition Assessment  DOCUMENTATION CODES:   Not applicable  INTERVENTION:   Ensure Enlive po BID, each supplement provides 350 kcal and 20 grams of protein.  MVI po daily   NUTRITION DIAGNOSIS:   Increased nutrient needs related to post-op healing as evidenced by estimated needs.  GOAL:   Patient will meet greater than or equal to 90% of their needs  MONITOR:   PO intake, Supplement acceptance, Labs, Weight trends, I & O's, Skin  REASON FOR ASSESSMENT:   Malnutrition Screening Tool    ASSESSMENT:   88 y/o female with h/o Afib, HLD, GERD, DM, COPD and DDD who is s/p reverse total shoulder arthroplasty 2/20  RD unable to see pt today as pt unavailable at time of RD visit. Per RN documentation pt ate 100% of her breakfast this morning. RD will add supplements and MVI to support post op healing. Per chart, pt appears weight stable pta. Per MD note, pt to discharge tomorrow. RD will obtain nutrition related history and exam if pt does not discharge.   Medications reviewed and include: colace, protonix, vitamin D  Labs reviewed: K 4.4 wnl  NUTRITION - FOCUSED PHYSICAL EXAM: Unable to perform at this time   Diet Order:   Diet Order             Diet regular Room service appropriate? Yes; Fluid consistency: Thin  Diet effective now                  EDUCATION NEEDS:   Not appropriate for education at this time  Skin:  Skin Assessment: Reviewed RN Assessment (incision L shoulder)  Last BM:  pta  Height:   Ht Readings from Last 1 Encounters:  05/05/21 4\' 11"  (1.499 m)    Weight:   Wt Readings from Last 1 Encounters:  05/05/21 58.1 kg    Ideal Body Weight:  44.56 kg  BMI:  Body mass index is 25.85 kg/m.  Estimated Nutritional Needs:   Kcal:  1300-1500kcal/day  Protein:  65-75g/day  Fluid:  1.1-1.3L/day  Koleen Distance MS, RD, LDN Please refer to [facility] for RD and/or RD on-call/weekend/after hours pager

## 2021-05-07 LAB — CREATININE, SERUM
Creatinine, Ser: 0.65 mg/dL (ref 0.44–1.00)
GFR, Estimated: >60 mL/min (ref >60)

## 2021-05-07 LAB — SURGICAL PATHOLOGY

## 2021-05-07 MED ORDER — PANTOPRAZOLE SODIUM 40 MG PO TBEC
DELAYED_RELEASE_TABLET | ORAL | Status: AC
  Administered 2021-05-07: 40 mg via ORAL
  Filled 2021-05-07: qty 1

## 2021-05-07 MED ORDER — ACETAMINOPHEN 325 MG PO TABS
ORAL_TABLET | ORAL | Status: AC
  Filled 2021-05-07: qty 2

## 2021-05-07 MED ORDER — ACETAMINOPHEN 500 MG PO TABS: 500.0000 mg | ORAL_TABLET | Freq: Three times a day (TID) | ORAL | 0 refills | Status: AC | PRN

## 2021-05-07 MED ORDER — OXYCODONE HCL 5 MG PO TABS
ORAL_TABLET | ORAL | Status: AC
  Filled 2021-05-07: qty 1

## 2021-05-07 MED ORDER — ACETAMINOPHEN 500 MG PO TABS: 1000.0000 mg | ORAL_TABLET | Freq: Four times a day (QID) | ORAL | Status: DC | PRN

## 2021-05-07 MED ORDER — ACETAMINOPHEN 500 MG PO TABS
1000.0000 mg | ORAL_TABLET | Freq: Three times a day (TID) | ORAL | Status: DC | PRN
Start: 2021-05-07 — End: 2021-05-07
  Administered 2021-05-07: 1000 mg via ORAL

## 2021-05-07 MED ORDER — OXYCODONE HCL 5 MG PO TABS: 2.5000 mg | ORAL_TABLET | ORAL | 0 refills | Status: DC | PRN

## 2021-05-07 MED ORDER — ONDANSETRON HCL 4 MG PO TABS
4.0000 mg | ORAL_TABLET | Freq: Four times a day (QID) | ORAL | 0 refills | Status: AC | PRN
Start: 2021-05-07 — End: ?

## 2021-05-07 MED ORDER — ACETAMINOPHEN 500 MG PO TABS
ORAL_TABLET | ORAL | Status: AC
Start: 2021-05-07 — End: 2021-05-07
  Filled 2021-05-07: qty 2

## 2021-05-07 MED ORDER — DOCUSATE SODIUM 100 MG PO CAPS
ORAL_CAPSULE | ORAL | Status: AC
  Administered 2021-05-07: 100 mg via ORAL
  Filled 2021-05-07: qty 1

## 2021-05-07 NOTE — TOC Progression Note (Signed)
Transition of Care [facility]) - Progression Note    Patient Details  Name: Jennifer [name] MRN: [id number] Date of Birth: 07-17-1931  [facility] CM/SW Contact  Marlowe Sax, RN Phone Number: 05/07/2021, 10:49 AM  Clinical Narrative:    [facility] to [facility] todaym the bedside nurse to call report, will transport with non urgent EMS   Expected Discharge Plan: Skilled Nursing Facility Barriers to Discharge: Continued Medical Work up  Expected Discharge Plan and Services Expected Discharge Plan: Skilled Nursing Facility       Living arrangements for the past 2 months: Single Family Home Expected Discharge Date: 05/07/21                                     Social Determinants of Health (SDOH) Interventions    Readmission Risk Interventions No flowsheet data found.

## 2021-05-07 NOTE — Progress Notes (Signed)
Subjective: 2 Days Post-Op Procedure(s) (LRB): Left reverse shoulder arthroplasty, biceps tenodesis (Left) Patient reports pain as mild.   Patient is well, and has had no acute complaints or problems Plan is to go to rehab at this time.  Patient has chosen a bed at [facility]. Negative for chest pain and shortness of breath Fever: no Gastrointestinal: Negative for nausea and vomiting She states that she is passing gas and is also urinating well at this time.  Objective: Vital signs in last 24 hours: Temp:  [97 F (36.1 C)-97.7 F (36.5 C)] 97.5 F (36.4 C) (02/22 0734) Pulse Rate:  [53-90] 90 (02/22 0734) Resp:  [16-18] 18 (02/22 0734) BP: (111-145)/(46-58) 122/55 (02/22 0734) SpO2:  [96 %-99 %] 99 % (02/22 0734)  Intake/Output from previous day:  Intake/Output Summary (Last 24 hours) at 05/07/2021 0744 Last data filed at 05/06/2021 2000 Gross per 24 hour  Intake 560 ml  Output 600 ml  Net -40 ml    Intake/Output this shift: No intake/output data recorded.  Labs: Recent Labs    05/06/21 0540  HGB 11.6*   Recent Labs    05/06/21 0540  WBC 8.2  RBC 3.83*  HCT 36.7  PLT 251   Recent Labs    05/06/21 0540 05/07/21 0529  NA 137  --   K 4.4  --   CL 105  --   CO2 23  --   BUN 14  --   CREATININE 0.66 0.65  GLUCOSE 308*  --   CALCIUM 9.0  --    No results for input(s): LABPT, INR in the last 72 hours.   EXAM General - Patient is Alert and Oriented Extremity - ABD soft Neurovascular intact Sensation intact distally Dorsiflexion/Plantar flexion intact Sensation is beginning to return to the left arm following nerve block. Dressing/Incision - clean, dry, honeycomb intact. Motor Function - intact, moving fingers and wrist well on exam.  Able to give posterior pressure for deltoid function.  Past Medical History:  Diagnosis Date   Arthritis    Atrial fibrillation (HCC)    a.) CHA2DS2-VASc = 7 (age x 2, sex, HTN, TIA x 2, T2DM). b.) rate/rhythm  maintained on oral diltiazem; chronically anticoagulated using rivaroxaban   B12 deficiency    Complication of anesthesia    a.) delayed emergence following kyphoplasty   COPD (chronic obstructive pulmonary disease) (HCC)    DDD (degenerative disc disease), lumbar    Diastolic dysfunction 03/02/2016   a.)  TTE 03/02/2016: EF 65%; trivial MR; G1DD. b.)  TTE 11/20/2020: EF >55%; trivial MR, mild TR; G1DD.   Diverticulosis    GERD (gastroesophageal reflux disease)    History of kidney stones    HLD (hyperlipidemia)    Hypertension    Long term current use of anticoagulant    a.) Rivaroxaban   Post-menopausal osteoporosis    Post-poliomyelitis muscular atrophy    a.) LLE weakness; wears brace   T2DM (type 2 diabetes mellitus) (HCC)    TIA (transient ischemic attack)    Venous insufficiency of both lower extremities    Vitamin D deficiency     Assessment/Plan: 2 Days Post-Op Procedure(s) (LRB): Left reverse shoulder arthroplasty, biceps tenodesis (Left) Principal Problem:   S/p reverse total shoulder arthroplasty  Estimated body mass index is 25.85 kg/m as calculated from the following:   Height as of this encounter: 4\' 11"  (1.499 m).   Weight as of this encounter: 58.1 kg. Up with therapy  Vitals stable this AM. Up  with therapy today. Patient is urinating well and passing gas without pain. Plan for discharge to SNF today pending insurance authorization.  DVT Prophylaxis - Xarelto Shoulder immobilizer to left arm at all times, except bathing and physical therapy.  Valeria Batman, PA-C Orthopaedic Surgery 05/07/2021, 7:44 AM

## 2021-05-07 NOTE — Progress Notes (Signed)
Physical Therapy Treatment Patient Details Name: Jennifer [name] MRN: [id number] DOB: [birth year] Today's Date: 05/07/2021   History of Present Illness 88 yo Female admitted for L reverse total shoulder on 05/05/21.  PMH: HTN, OA, hx of post-polio muscular atrophy.    PT Comments    Pt continues to need consistent cuing and reinforcement along with repeated education for expectations post op and likely course of recovery.  She continues to struggle to rise to standing even from proportionally high surfaces - she cites not having both arms as the issue and while certainly having some effect but given her prior status she is functionally more limited than would be expected for someone as ostensibly as independent as she was prior to surgery.    Recommendations for follow up therapy are one component of a multi-disciplinary discharge planning process, led by the attending physician.  Recommendations may be updated based on patient status, additional functional criteria and insurance authorization.  Follow Up Recommendations  Skilled nursing-short term rehab (<3 hours/day)     Assistance Recommended at Discharge Intermittent Supervision/Assistance  Patient can return home with the following A lot of help with walking and/or transfers;A lot of help with bathing/dressing/bathroom;Assistance with cooking/housework;Assist for transportation   Equipment Recommendations  None recommended by PT    Recommendations for Other Services       Precautions / Restrictions Precautions Precautions: Fall;Shoulder Type of Shoulder Precautions: shoulder abduction brace Shoulder Interventions: Shoulder sling/immobilizer;Shoulder abduction pillow;Off for dressing/bathing/exercises Precaution Booklet Issued: No Restrictions Weight Bearing Restrictions: Yes LUE Weight Bearing: Non weight bearing     Mobility  Bed Mobility               General bed mobility comments: received and left in chair     Transfers Overall transfer level: Needs assistance Equipment used: Quad cane Transfers: Sit to/from Stand Sit to Stand: Mod assist           General transfer comment: assisted pt with set up and cuing to try to appropraitely transition to standing from (relative to her height) an adequately raised seat height... Despite much effort she was unable to rise w/o direct assist and even with only min assist could not elevate to standing.    Ambulation/Gait Ambulation/Gait assistance: Min assist Gait Distance (Feet): 80 Feet Assistive device: Quad cane         General Gait Details: Again pt showed good effort but needed constant cuing and encouragement during the effort and though there was some small scale instability she had no LOBS.  Pt  still c/o issues with "my crippled foot" and voices having much more confidence with walker but PT reiterates that she will not be able to effectively use walker for a while.   Stairs             Wheelchair Mobility    Modified Rankin (Stroke Patients Only)       Balance Overall balance assessment: Needs assistance Sitting-balance support: Feet supported, Single extremity supported Sitting balance-Leahy Scale: Good     Standing balance support: Single extremity supported Standing balance-Leahy Scale: Fair Standing balance comment: better coordination with cane this afternoon, still guarded and cautious with upright activity                            Cognition Arousal/Alertness: Awake/alert Behavior During Therapy: WFL for tasks assessed/performed Overall Cognitive Status: Within Functional Limits for tasks assessed  Exercises      General Comments General comments (skin integrity, edema, etc.): Pt continues to voice anxiety about her situation post-op.  Cleaned velcro on AFO, post session the strap actually remained closed      Pertinent Vitals/Pain  Pain Assessment Pain Assessment: Faces Faces Pain Scale: Hurts a little bit    Home Living                          Prior Function            PT Goals (current goals can now be found in the care plan section) Progress towards PT goals: Progressing toward goals    Frequency    BID      PT Plan Current plan remains appropriate    Co-evaluation              AM-PAC PT "6 Clicks" Mobility   Outcome Measure  Help needed turning from your back to your side while in a flat bed without using bedrails?: A Little Help needed moving from lying on your back to sitting on the side of a flat bed without using bedrails?: A Little Help needed moving to and from a bed to a chair (including a wheelchair)?: A Lot Help needed standing up from a chair using your arms (e.g., wheelchair or bedside chair)?: A Lot Help needed to walk in hospital room?: A Lot Help needed climbing 3-5 steps with a railing? : A Lot 6 Click Score: 14    End of Session Equipment Utilized During Treatment: Gait belt Activity Tolerance: Patient tolerated treatment well Patient left: in chair;with call bell/phone within reach Nurse Communication: Mobility status PT Visit Diagnosis: Muscle weakness (generalized) (M62.81);Difficulty in walking, not elsewhere classified (R26.2);Unsteadiness on feet (R26.81)     Time: 0962-8366 PT Time Calculation (min) (ACUTE ONLY): 18 min  Charges:  $Gait Training: 8-22 mins                     Malachi Pro, DPT 05/07/2021, 12:45 PM

## 2021-05-07 NOTE — Discharge Summary (Signed)
Physician Discharge Summary  Patient ID: Jennifer [name] MRN: [id number] DOB/AGE: 1931/05/06 88 y.o.  Admit date: 05/05/2021 Discharge date: 05/07/2021  Admission Diagnoses:  S/p reverse total shoulder arthroplasty [Z96.619] Left shoulder degenerative changes with rotator cuff tear  Discharge Diagnoses: Patient Active Problem List   Diagnosis Date Noted   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/05/2021   Nausea vomiting and diarrhea    Hypokalemia    Acute gastroenteritis    Atrial fibrillation with RVR (HCC) 06/17/2020   A-fib (HCC) 03/01/2016    Past Medical History:  Diagnosis Date   Arthritis    Atrial fibrillation (HCC)    a.) CHA2DS2-VASc = 7 (age x 2, sex, HTN, TIA x 2, T2DM). b.) rate/rhythm maintained on oral diltiazem; chronically anticoagulated using rivaroxaban   B12 deficiency    Complication of anesthesia    a.) delayed emergence following kyphoplasty   COPD (chronic obstructive pulmonary disease) (HCC)    DDD (degenerative disc disease), lumbar    Diastolic dysfunction 03/02/2016   a.)  TTE 03/02/2016: EF 65%; trivial MR; G1DD. b.)  TTE 11/20/2020: EF >55%; trivial MR, mild TR; G1DD.   Diverticulosis    GERD (gastroesophageal reflux disease)    History of kidney stones    HLD (hyperlipidemia)    Hypertension    Long term current use of anticoagulant    a.) Rivaroxaban   Post-menopausal osteoporosis    Post-poliomyelitis muscular atrophy    a.) LLE weakness; wears brace   T2DM (type 2 diabetes mellitus) (HCC)    TIA (transient ischemic attack)    Venous insufficiency of both lower extremities    Vitamin D deficiency      Transfusion: None.   Consultants (if any):   Discharged Condition: Improved  Hospital Course: Jennifer [name] is an 88 y.o. female who was admitted 05/05/2021 with a diagnosis of left shoulder osteoarthritis with underlying rotator cuff tear and went to the operating room on 05/05/2021 and underwent the above named  procedures.    Surgeries: Procedure(s): Left reverse shoulder arthroplasty, biceps tenodesis on 05/05/2021 Patient tolerated the surgery well. Taken to PACU where she was stabilized and then transferred to the orthopedic floor.  She was continued on Xarelto 20mg  daily for DVT prophylaxis. Foot pumps applied bilaterally at 80 mm. Heels elevated on bed with rolled towels. No evidence of DVT. Negative Homan. Physical therapy started on day #1 for gait training and transfer. OT started day #1 for ADL and assisted devices.  Patient's IV was removed on POD2.  Hemovac was removed on POD1.  Implants: [employer] Surgical: RSP Glenoid Head w/Retaining screw 32-4; Monoblock Reverse Shoulder Baseplate with 6.48mm central screw; 2 locking screws into baseplate (superior, inferior); Small Shell Humeral Stem 10 x ; Neutral Small Socket Insert  She was given perioperative antibiotics:  Anti-infectives (From admission, onward)    Start     Dose/Rate Route Frequency Ordered Stop   05/05/21 1600  ceFAZolin (ANCEF) IVPB 2g/100 mL premix        2 g 200 mL/hr over 30 Minutes Intravenous Every 8 hours 05/05/21 1545 05/06/21 1159   05/05/21 1405  vancomycin (VANCOCIN) powder  Status:  Discontinued          As needed 05/05/21 1405 05/05/21 1445   05/05/21 0922  ceFAZolin (ANCEF) 2-4 GM/100ML-% IVPB       Note to Pharmacy: Guadalupe Dawn B: cabinet override      05/05/21 0922 05/05/21 1216   05/05/21 0600  ceFAZolin (ANCEF) IVPB 2g/100  mL premix        2 g 200 mL/hr over 30 Minutes Intravenous On call to O.R. 05/04/21 2312 05/06/21 2947     .  She was given sequential compression devices, early ambulation, and Xarelto for DVT prophylaxis.  She benefited maximally from the hospital stay and there were no complications.    Recent vital signs:  Vitals:   05/07/21 0352 05/07/21 0734  BP: (!) 139/52 (!) 122/55  Pulse: 89 90  Resp: 18 18  Temp: (!) 97.3 F (36.3 C) (!) 97.5 F (36.4 C)  SpO2: 98% 99%     Recent laboratory studies:  Lab Results  Component Value Date   HGB 11.6 (L) 05/06/2021   HGB 11.6 (L) 03/30/2021   HGB 10.2 (L) 06/19/2020   Lab Results  Component Value Date   WBC 8.2 05/06/2021   PLT 251 05/06/2021   Lab Results  Component Value Date   INR 1.60 09/17/2017   Lab Results  Component Value Date   NA 137 05/06/2021   K 4.4 05/06/2021   CL 105 05/06/2021   CO2 23 05/06/2021   BUN 14 05/06/2021   CREATININE 0.65 05/07/2021   GLUCOSE 308 (H) 05/06/2021    Discharge Medications:   Allergies as of 05/07/2021       Reactions   Esomeprazole Magnesium Diarrhea   *Nexium    Nitrofurantoin    "Flu"        Medication List     TAKE these medications    acetaminophen 500 MG tablet Commonly known as: TYLENOL Take 1-2 tablets (500-1,000 mg total) by mouth every 8 (eight) hours as needed for mild pain. What changed:  when to take this reasons to take this   diltiazem 240 MG 24 hr capsule Commonly known as: CARDIZEM CD Take 240 mg by mouth every morning.   lisinopril 40 MG tablet Commonly known as: ZESTRIL Take 40 mg by mouth every morning.   omeprazole 20 MG capsule Commonly known as: PRILOSEC Take 20 mg by mouth every morning. Pt may take a 2nd dose in the evening if needed for acid reflux   ondansetron 4 MG tablet Commonly known as: ZOFRAN Take 1 tablet (4 mg total) by mouth every 6 (six) hours as needed for nausea.   oxyCODONE 5 MG immediate release tablet Commonly known as: Oxy IR/ROXICODONE Take 0.5-1 tablets (2.5-5 mg total) by mouth every 4 (four) hours as needed for severe pain.   rivaroxaban 20 MG Tabs tablet Commonly known as: XARELTO Take 1 tablet (20 mg total) by mouth daily with supper.   simvastatin 10 MG tablet Commonly known as: ZOCOR Take 10 mg by mouth at bedtime.   Vitamin D (Ergocalciferol) 1.25 MG (50000 UNIT) Caps capsule Commonly known as: DRISDOL Take 50,000 Units by mouth every 14 (fourteen) days.         Diagnostic Studies: CT SHOULDER LEFT WO CONTRAST  Result Date: 04/17/2021 CLINICAL DATA:  Left shoulder rotator cuff insufficiency. Pain and decreased range of motion. Status post anterior shoulder dislocation 03/30/2021. EXAM: CT OF THE UPPER LEFT EXTREMITY WITHOUT CONTRAST TECHNIQUE: Multidetector CT imaging of the upper left extremity was performed according to the standard protocol. RADIATION DOSE REDUCTION: This exam was performed according to the departmental dose-optimization program which includes automated exposure control, adjustment of the mA and/or kV according to patient size and/or use of iterative reconstruction technique. COMPARISON:  MR left shoulder arthrogram 04/11/2021 left shoulder radiographs 03/31/2021 FINDINGS: Bones/Joint/Cartilage Mild glenohumeral joint space narrowing  with mild anterior glenoid degenerative osteophytosis. Mild inferior humeral head-neck junction degenerative osteophytosis. Moderate joint space narrowing and peripheral osteophytosis degenerative changes of the acromioclavicular joint. No acute fracture. Ligaments Suboptimally assessed by CT. Muscles and Tendons Moderate anterior supraspinatus muscle atrophy, as seen on prior MRI. Soft tissues Moderate glenohumeral joint effusion. There are moderate scattered calcifications seen within the joint fluid especially within the posterior glenohumeral joint and within the subacromial/subdeltoid bursa in between the supraspinatus and the acromion. This calcification may be the sequela of the suspected hemorrhagic fluid within the soft tissues inferior to the visualized torn glenohumeral ligament. Moderate calcifications within the thoracic aorta. Mild anterior left upper lobe curvilinear subsegmental atelectasis versus scarring. No axillary lymphadenopathy. IMPRESSION:: IMPRESSION: 1. Mild glenohumeral osteoarthritis. Moderate acromioclavicular osteoarthritis. 2. Moderate anterior supraspinatus muscle atrophy. 3. Moderate  glenohumeral joint effusion. Note is made on recent MRI of humeral avulsion of the inferior glenohumeral ligament and hemorrhagic blood products seen inferior to the torn ligament. On the current CT, there are scattered calcifications within the glenohumeral joint fluid. Question whether these may be secondary to evolution of the joint fluid blood products seen on prior MRI following anterior shoulder dislocation 03/30/2021. Electronically Signed   By: Neita Garnetonald  Viola M.D.   On: 04/17/2021 15:55   MR SHOULDER LEFT WO CONTRAST  Result Date: 04/13/2021 CLINICAL DATA:  Limited range of motion, generalized soreness status post fall. EXAM: MRI OF THE LEFT SHOULDER WITHOUT CONTRAST TECHNIQUE: Multiplanar, multisequence MR imaging of the shoulder was performed. No intravenous contrast was administered. COMPARISON:  None. FINDINGS: Rotator cuff: Complete tear of the supraspinatus and infraspinatus tendons with 3.9 cm of retraction. Moderate tendinosis of the teres minor tendon. Mild tendinosis of the subscapularis tendon. Muscles: No muscle atrophy. Mild edema in the infraspinatus and teres minor muscles likely reflecting mild muscle strain. No intramuscular fluid collection or hematoma. Biceps Long Head: Moderate tendinosis of the intra-articular portion of the long head of the biceps tendon. Acromioclavicular Joint: Moderate arthropathy of the acromioclavicular joint. Large amount of subacromial/subdeltoid bursal fluid. Glenohumeral Joint: Large joint effusion. Partial-thickness cartilage loss of the glenohumeral joint. Humeral avulsion of the inferior glenohumeral ligament. Hemorrhagic fluid in the soft tissues inferior to the torn glenohumeral ligament. Labrum: Grossly intact, but evaluation is limited by lack of intraarticular fluid/contrast. Bones: No fracture or dislocation. No aggressive osseous lesion. IMPRESSION: 1. Complete tear of the supraspinatus and infraspinatus tendons with 3.9 cm of retraction. 2.  Moderate tendinosis of the teres minor and subscapularis tendon. 3. Moderate tendinosis of the intra-articular portion of the long head of the biceps tendon. 4. Humeral avulsion of the inferior glenohumeral ligament. Hemorrhagic fluid in the soft tissues inferior to the torn glenohumeral ligament. Electronically Signed   By: Elige KoHetal  Patel M.D.   On: 04/13/2021 09:05   DG Shoulder Left Port  Result Date: 05/05/2021 CLINICAL DATA:  Status post left shoulder arthroplasty EXAM: LEFT SHOULDER COMPARISON:  03/31/2018 FINDINGS: There is interval reverse left shoulder arthroplasty. No fracture is seen. IMPRESSION: Status post reverse left shoulder arthroplasty. Electronically Signed   By: Ernie AvenaPalani  Rathinasamy M.D.   On: 05/05/2021 15:45   US OR NERVE BLOCK-IMAGE ONLY [facility])  Result Date: 05/05/2021 There is no interpretation for this exam.  This order is for images obtained during a surgical procedure.  Please See "Surgeries" Tab for more information regarding the procedure.    Disposition: Plan for discharge to SNF today pending insurance approval.     Follow-up Information     Signa Kellatel, Sunny,  MD Follow up in 2 week(s).   Specialty: Orthopedic Surgery Why: Wound care and x-rays of the left shoulder Contact information: [address] [place] [address] [phone]                Signed: Meriel Pica PA-C 05/07/2021, 7:49 AM

## 2021-05-07 NOTE — Progress Notes (Signed)
Patient notified that her polar care is no longer cold, switched power cord for cooler and it is now cooling. Patient resting comfortably in bed.

## 2021-05-07 NOTE — Progress Notes (Signed)
Occupational Therapy Treatment Patient Details Name: Jennifer [name] MRN: [id number] DOB: [birth year] Today's Date: 05/07/2021   History of present illness 88 yo Female admitted for L reverse total shoulder on 05/05/21.  PMH: HTN, OA, hx of post-polio muscular atrophy.   OT comments  [name] was seen for OT treatment on this date. Upon arrival to room pt reclined in chair, agrees to tx with encouragement. Pt continues to report anxiety re: mobility and requires consistent cues. MOD A + MAX cues sit>stand - cues to scoot to end of bed, requires B feet blocked to prevent sliding 2/2 poor grip on shoes, assist for lift off. MAX A don/doff sling and polar care - of note sling was unattached from bolster upon arrival. Pt making progress toward goals. Will continue to follow POC. Discharge recommendation remains appropriate.     Recommendations for follow up therapy are one component of a multi-disciplinary discharge planning process, led by the attending physician.  Recommendations may be updated based on patient status, additional functional criteria and insurance authorization.    Follow Up Recommendations  Skilled nursing-short term rehab (<3 hours/day)    Assistance Recommended at Discharge Frequent or constant Supervision/Assistance  Patient can return home with the following  A lot of help with walking and/or transfers;A lot of help with bathing/dressing/bathroom;Assistance with cooking/housework;Help with stairs or ramp for entrance   Equipment Recommendations  BSC/3in1    Recommendations for Other Services      Precautions / Restrictions Precautions Precautions: Fall;Shoulder Type of Shoulder Precautions: shoulder abduction brace Shoulder Interventions: Shoulder sling/immobilizer;Shoulder abduction pillow;Off for dressing/bathing/exercises Precaution Booklet Issued: No Restrictions Weight Bearing Restrictions: Yes LUE Weight Bearing: Non weight bearing       Mobility Bed  Mobility               General bed mobility comments: received and left in chair    Transfers Overall transfer level: Needs assistance Equipment used: Quad cane Transfers: Sit to/from Stand Sit to Stand: Mod assist           General transfer comment: cues to scoot to end of bed, requires B feet blocked to prevent sliding 2/2 poor grip on shoes, assist for lift off     Balance Overall balance assessment: Needs assistance Sitting-balance support: Feet supported, Single extremity supported Sitting balance-Leahy Scale: Good     Standing balance support: Single extremity supported Standing balance-Leahy Scale: Fair                             ADL either performed or assessed with clinical judgement   ADL Overall ADL's : Needs assistance/impaired                                       General ADL Comments: MAX A don/doff sling and polar care - of note sling was unattached from bolster upon arrival. MOD A + MAX cues for ADL t/f      Cognition Arousal/Alertness: Awake/alert Behavior During Therapy: WFL for tasks assessed/performed Overall Cognitive Status: Within Functional Limits for tasks assessed                                           Pertinent Vitals/ Pain       Pain  Assessment Pain Assessment: 0-10 Pain Score: 3  Pain Location: L shoulder with sling removal Pain Descriptors / Indicators: Operative site guarding, Grimacing Pain Intervention(s): Limited activity within patient's tolerance, Repositioned   Frequency  Min 3X/week        Progress Toward Goals  OT Goals(current goals can now be found in the care plan section)  Progress towards OT goals: Progressing toward goals  Acute Rehab OT Goals Patient Stated Goal: to feel better OT Goal Formulation: With patient Time For Goal Achievement: 05/20/21 Potential to Achieve Goals: Good ADL Goals Pt Will Perform Grooming: with min assist;standing Pt Will  Perform Lower Body Dressing: with min assist;sitting/lateral leans Pt Will Transfer to Toilet: with min guard assist;ambulating;regular height toilet  Plan Discharge plan remains appropriate;Frequency remains appropriate    Co-evaluation                 AM-PAC OT "6 Clicks" Daily Activity     Outcome Measure   Help from another person eating meals?: A Little Help from another person taking care of personal grooming?: A Little Help from another person toileting, which includes using toliet, bedpan, or urinal?: A Lot Help from another person bathing (including washing, rinsing, drying)?: A Lot Help from another person to put on and taking off regular upper body clothing?: A Lot Help from another person to put on and taking off regular lower body clothing?: A Lot 6 Click Score: 14    End of Session    OT Visit Diagnosis: Muscle weakness (generalized) (M62.81);Other abnormalities of gait and mobility (R26.89)   Activity Tolerance Patient tolerated treatment well   Patient Left in chair;with call bell/phone within reach (PT in room)   Nurse Communication          Time: [id number] OT Time Calculation (min): 12 min  Charges: OT General Charges $OT Visit: 1 Visit OT Treatments $Self Care/Home Management : 8-22 mins  Dessie Coma, M.S. OTR/L  05/07/21, 10:17 AM  ascom [phone]

## 2021-07-15 ENCOUNTER — Inpatient Hospital Stay: Payer: Medicare Other

## 2021-07-15 ENCOUNTER — Emergency Department: Payer: Medicare Other

## 2021-07-15 ENCOUNTER — Other Ambulatory Visit: Payer: Self-pay

## 2021-07-15 ENCOUNTER — Inpatient Hospital Stay
Admission: EM | Admit: 2021-07-15 | Discharge: 2021-07-18 | DRG: 638 | Disposition: A | Discharge status: Home / Self Care | Payer: Medicare Other | Attending: Internal Medicine | Admitting: Internal Medicine

## 2021-07-15 DIAGNOSIS — W19XXXA Unspecified fall, initial encounter: Secondary | ICD-10-CM | POA: Diagnosis not present

## 2021-07-15 DIAGNOSIS — E871 Hypo-osmolality and hyponatremia: Secondary | ICD-10-CM | POA: Diagnosis present

## 2021-07-15 DIAGNOSIS — E559 Vitamin D deficiency, unspecified: Secondary | ICD-10-CM | POA: Diagnosis present

## 2021-07-15 DIAGNOSIS — B91 Sequelae of poliomyelitis: Secondary | ICD-10-CM | POA: Diagnosis not present

## 2021-07-15 DIAGNOSIS — G8314 Monoplegia of lower limb affecting left nondominant side: Secondary | ICD-10-CM | POA: Diagnosis present

## 2021-07-15 DIAGNOSIS — R739 Hyperglycemia, unspecified: Secondary | ICD-10-CM

## 2021-07-15 DIAGNOSIS — M1990 Unspecified osteoarthritis, unspecified site: Secondary | ICD-10-CM | POA: Diagnosis present

## 2021-07-15 DIAGNOSIS — Z96612 Presence of left artificial shoulder joint: Secondary | ICD-10-CM | POA: Diagnosis present

## 2021-07-15 DIAGNOSIS — E1101 Type 2 diabetes mellitus with hyperosmolarity with coma: Secondary | ICD-10-CM | POA: Diagnosis present

## 2021-07-15 DIAGNOSIS — M810 Age-related osteoporosis without current pathological fracture: Secondary | ICD-10-CM | POA: Diagnosis present

## 2021-07-15 DIAGNOSIS — W010XXA Fall on same level from slipping, tripping and stumbling without subsequent striking against object, initial encounter: Secondary | ICD-10-CM | POA: Diagnosis present

## 2021-07-15 DIAGNOSIS — I10 Essential (primary) hypertension: Secondary | ICD-10-CM

## 2021-07-15 DIAGNOSIS — M6250 Muscle wasting and atrophy, not elsewhere classified, unspecified site: Secondary | ICD-10-CM | POA: Diagnosis present

## 2021-07-15 DIAGNOSIS — I482 Chronic atrial fibrillation, unspecified: Secondary | ICD-10-CM | POA: Diagnosis present

## 2021-07-15 DIAGNOSIS — Z8249 Family history of ischemic heart disease and other diseases of the circulatory system: Secondary | ICD-10-CM

## 2021-07-15 DIAGNOSIS — Z8673 Personal history of transient ischemic attack (TIA), and cerebral infarction without residual deficits: Secondary | ICD-10-CM | POA: Diagnosis not present

## 2021-07-15 DIAGNOSIS — Z833 Family history of diabetes mellitus: Secondary | ICD-10-CM | POA: Diagnosis not present

## 2021-07-15 DIAGNOSIS — E785 Hyperlipidemia, unspecified: Secondary | ICD-10-CM | POA: Diagnosis present

## 2021-07-15 DIAGNOSIS — I4891 Unspecified atrial fibrillation: Secondary | ICD-10-CM | POA: Diagnosis present

## 2021-07-15 DIAGNOSIS — Z823 Family history of stroke: Secondary | ICD-10-CM | POA: Diagnosis not present

## 2021-07-15 DIAGNOSIS — E538 Deficiency of other specified B group vitamins: Secondary | ICD-10-CM | POA: Diagnosis present

## 2021-07-15 DIAGNOSIS — Z87442 Personal history of urinary calculi: Secondary | ICD-10-CM

## 2021-07-15 DIAGNOSIS — K219 Gastro-esophageal reflux disease without esophagitis: Secondary | ICD-10-CM | POA: Diagnosis present

## 2021-07-15 DIAGNOSIS — R55 Syncope and collapse: Secondary | ICD-10-CM

## 2021-07-15 DIAGNOSIS — I872 Venous insufficiency (chronic) (peripheral): Secondary | ICD-10-CM | POA: Diagnosis present

## 2021-07-15 DIAGNOSIS — Y92002 Bathroom of unspecified non-institutional (private) residence single-family (private) house as the place of occurrence of the external cause: Secondary | ICD-10-CM

## 2021-07-15 DIAGNOSIS — Z881 Allergy status to other antibiotic agents status: Secondary | ICD-10-CM | POA: Diagnosis not present

## 2021-07-15 DIAGNOSIS — E1169 Type 2 diabetes mellitus with other specified complication: Secondary | ICD-10-CM | POA: Diagnosis present

## 2021-07-15 DIAGNOSIS — Z79899 Other long term (current) drug therapy: Secondary | ICD-10-CM

## 2021-07-15 DIAGNOSIS — Z888 Allergy status to other drugs, medicaments and biological substances status: Secondary | ICD-10-CM

## 2021-07-15 DIAGNOSIS — J449 Chronic obstructive pulmonary disease, unspecified: Secondary | ICD-10-CM | POA: Diagnosis present

## 2021-07-15 DIAGNOSIS — Z7901 Long term (current) use of anticoagulants: Secondary | ICD-10-CM

## 2021-07-15 DIAGNOSIS — I480 Paroxysmal atrial fibrillation: Secondary | ICD-10-CM | POA: Diagnosis present

## 2021-07-15 DIAGNOSIS — Z96619 Presence of unspecified artificial shoulder joint: Secondary | ICD-10-CM

## 2021-07-15 LAB — CBC WITH DIFFERENTIAL/PLATELET
Abs Immature Granulocytes: 0.04 10*3/uL (ref 0.00–0.07)
Basophils Absolute: 0 10*3/uL (ref 0.0–0.1)
Basophils Relative: 0 %
Eosinophils Absolute: 0 10*3/uL (ref 0.0–0.5)
Eosinophils Relative: 0 %
HCT: 38.4 % (ref 36.0–46.0)
Hemoglobin: 12 g/dL (ref 12.0–15.0)
Immature Granulocytes: 1 %
Lymphocytes Relative: 19 %
Lymphs Abs: 1.4 10*3/uL (ref 0.7–4.0)
MCH: 28.1 pg (ref 26.0–34.0)
MCHC: 31.3 g/dL (ref 30.0–36.0)
MCV: 89.9 fL (ref 80.0–100.0)
Monocytes Absolute: 0.5 10*3/uL (ref 0.1–1.0)
Monocytes Relative: 7 %
Neutro Abs: 5.5 10*3/uL (ref 1.7–7.7)
Neutrophils Relative %: 73 %
Platelets: 312 10*3/uL (ref 150–400)
RBC: 4.27 MIL/uL (ref 3.87–5.11)
RDW: 13.6 % (ref 11.5–15.5)
WBC: 7.5 10*3/uL (ref 4.0–10.5)
nRBC: 0 % (ref 0.0–0.2)

## 2021-07-15 LAB — COMPREHENSIVE METABOLIC PANEL
ALT: 18 U/L (ref 0–44)
AST: 18 U/L (ref 15–41)
Albumin: 3.9 g/dL (ref 3.5–5.0)
Alkaline Phosphatase: 188 U/L — ABNORMAL HIGH (ref 38–126)
Anion gap: 14 (ref 5–15)
BUN: 22 mg/dL (ref 8–23)
CO2: 21 mmol/L — ABNORMAL LOW (ref 22–32)
Calcium: 10.1 mg/dL (ref 8.9–10.3)
Chloride: 95 mmol/L — ABNORMAL LOW (ref 98–111)
Creatinine, Ser: 0.94 mg/dL (ref 0.44–1.00)
GFR, Estimated: 59 mL/min — ABNORMAL LOW (ref >60)
Glucose, Bld: 626 mg/dL (ref 70–99)
Potassium: 4.7 mmol/L (ref 3.5–5.1)
Sodium: 130 mmol/L — ABNORMAL LOW (ref 135–145)
Total Bilirubin: 1 mg/dL (ref 0.3–1.2)
Total Protein: 7.5 g/dL (ref 6.5–8.1)

## 2021-07-15 LAB — LACTIC ACID, PLASMA
Lactic Acid, Venous: 2.3 mmol/L (ref 0.5–1.9)
Lactic Acid, Venous: 1.3 mmol/L (ref 0.5–1.9)

## 2021-07-15 LAB — URINALYSIS, ROUTINE W REFLEX MICROSCOPIC
Bilirubin Urine: NEGATIVE
Glucose, UA: >=500 mg/dL — AB
Ketones, ur: 20 mg/dL — AB
Leukocytes,Ua: NEGATIVE
Nitrite: NEGATIVE
Protein, ur: NEGATIVE mg/dL
Specific Gravity, Urine: 1.029 (ref 1.005–1.030)
pH: 5 (ref 5.0–8.0)

## 2021-07-15 LAB — BASIC METABOLIC PANEL
Anion gap: 11 (ref 5–15)
BUN: 16 mg/dL (ref 8–23)
CO2: 22 mmol/L (ref 22–32)
Calcium: 9 mg/dL (ref 8.9–10.3)
Chloride: 102 mmol/L (ref 98–111)
Creatinine, Ser: 0.66 mg/dL (ref 0.44–1.00)
GFR, Estimated: >60 mL/min (ref >60)
Glucose, Bld: 418 mg/dL — ABNORMAL HIGH (ref 70–99)
Potassium: 4.3 mmol/L (ref 3.5–5.1)
Sodium: 135 mmol/L (ref 135–145)

## 2021-07-15 LAB — CBG MONITORING, ED
Glucose-Capillary: 551 mg/dL (ref 70–99)
Glucose-Capillary: 363 mg/dL — ABNORMAL HIGH (ref 70–99)
Glucose-Capillary: 289 mg/dL — ABNORMAL HIGH (ref 70–99)
Glucose-Capillary: 372 mg/dL — ABNORMAL HIGH (ref 70–99)
Glucose-Capillary: 369 mg/dL — ABNORMAL HIGH (ref 70–99)

## 2021-07-15 LAB — TROPONIN I (HIGH SENSITIVITY)
Troponin I (High Sensitivity): 9 ng/L (ref <18)
Troponin I (High Sensitivity): 8 ng/L (ref <18)

## 2021-07-15 LAB — BLOOD GAS, VENOUS
Acid-base deficit: 2.2 mmol/L — ABNORMAL HIGH (ref 0.0–2.0)
Bicarbonate: 22.4 mmol/L (ref 20.0–28.0)
O2 Saturation: 36.8 %
Patient temperature: 37
pCO2, Ven: 37 mmHg — ABNORMAL LOW (ref 44–60)
pH, Ven: 7.39 (ref 7.25–7.43)

## 2021-07-15 LAB — LIPASE, BLOOD: Lipase: 46 U/L (ref 11–51)

## 2021-07-15 LAB — OSMOLALITY: Osmolality: 307 mOsm/kg — ABNORMAL HIGH (ref 275–295)

## 2021-07-15 LAB — BETA-HYDROXYBUTYRIC ACID: Beta-Hydroxybutyric Acid: 3.78 mmol/L — ABNORMAL HIGH (ref 0.05–0.27)

## 2021-07-15 MED ORDER — SODIUM CHLORIDE 0.9 % IV BOLUS
1000.0000 mL | Freq: Once | INTRAVENOUS | Status: AC
  Administered 2021-07-15: 1000 mL via INTRAVENOUS

## 2021-07-15 MED ORDER — LACTATED RINGERS IV SOLN: INTRAVENOUS | Status: DC

## 2021-07-15 MED ORDER — INSULIN ASPART 100 UNIT/ML IJ SOLN
5.0000 [IU] | Freq: Once | INTRAMUSCULAR | Status: AC
  Administered 2021-07-15: 5 [IU] via INTRAVENOUS
  Filled 2021-07-15: qty 1

## 2021-07-15 MED ORDER — POTASSIUM CHLORIDE 10 MEQ/100ML IV SOLN
10.0000 meq | INTRAVENOUS | Status: AC
  Administered 2021-07-15: 10 meq via INTRAVENOUS
  Filled 2021-07-15: qty 100

## 2021-07-15 MED ORDER — PANTOPRAZOLE SODIUM 40 MG PO TBEC
40.0000 mg | DELAYED_RELEASE_TABLET | Freq: Every day | ORAL | Status: DC
  Administered 2021-07-16 – 2021-07-18 (×3): 40 mg via ORAL
  Filled 2021-07-15 (×2): qty 1

## 2021-07-15 MED ORDER — LISINOPRIL 20 MG PO TABS
20.0000 mg | ORAL_TABLET | Freq: Every day | ORAL | Status: DC
  Administered 2021-07-17 – 2021-07-18 (×2): 20 mg via ORAL
  Filled 2021-07-15 (×2): qty 1

## 2021-07-15 MED ORDER — OXYCODONE HCL 5 MG PO TABS: 2.5000 mg | ORAL_TABLET | ORAL | Status: DC | PRN

## 2021-07-15 MED ORDER — INSULIN REGULAR(HUMAN) IN NACL 100-0.9 UT/100ML-% IV SOLN: INTRAVENOUS | Status: DC

## 2021-07-15 MED ORDER — SODIUM CHLORIDE 0.9 % IV SOLN: INTRAVENOUS | Status: DC

## 2021-07-15 MED ORDER — LACTATED RINGERS IV BOLUS: 20.0000 mL/kg | Freq: Once | INTRAVENOUS | Status: DC

## 2021-07-15 MED ORDER — INSULIN ASPART 100 UNIT/ML IJ SOLN
0.0000 [IU] | Freq: Every day | INTRAMUSCULAR | Status: DC
  Administered 2021-07-15: 5 [IU] via SUBCUTANEOUS
  Administered 2021-07-16 – 2021-07-17 (×2): 2 [IU] via SUBCUTANEOUS
  Filled 2021-07-15 (×3): qty 1

## 2021-07-15 MED ORDER — ONDANSETRON HCL 4 MG PO TABS
4.0000 mg | ORAL_TABLET | Freq: Four times a day (QID) | ORAL | Status: DC | PRN
  Administered 2021-07-17: 4 mg via ORAL
  Filled 2021-07-15: qty 1

## 2021-07-15 MED ORDER — DEXTROSE 50 % IV SOLN: 0.0000 mL | INTRAVENOUS | Status: DC | PRN

## 2021-07-15 MED ORDER — ACETAMINOPHEN 500 MG PO TABS
500.0000 mg | ORAL_TABLET | Freq: Three times a day (TID) | ORAL | Status: DC | PRN
  Administered 2021-07-17: 1000 mg via ORAL
  Filled 2021-07-15: qty 2

## 2021-07-15 MED ORDER — INSULIN GLARGINE-YFGN 100 UNIT/ML ~~LOC~~ SOLN
20.0000 [IU] | Freq: Every evening | SUBCUTANEOUS | Status: DC
  Administered 2021-07-15 – 2021-07-16 (×2): 20 [IU] via SUBCUTANEOUS
  Filled 2021-07-15 (×6): qty 0.2

## 2021-07-15 MED ORDER — INSULIN ASPART 100 UNIT/ML IJ SOLN
0.0000 [IU] | Freq: Three times a day (TID) | INTRAMUSCULAR | Status: DC
  Administered 2021-07-16: 5 [IU] via SUBCUTANEOUS
  Administered 2021-07-16: 1 [IU] via SUBCUTANEOUS
  Administered 2021-07-16: 2 [IU] via SUBCUTANEOUS
  Administered 2021-07-17: 7 [IU] via SUBCUTANEOUS
  Administered 2021-07-18 (×2): 5 [IU] via SUBCUTANEOUS
  Filled 2021-07-15 (×6): qty 1

## 2021-07-15 MED ORDER — SIMVASTATIN 20 MG PO TABS
10.0000 mg | ORAL_TABLET | Freq: Every day | ORAL | Status: DC
Start: 2021-07-15 — End: 2021-07-18
  Administered 2021-07-15 – 2021-07-16 (×2): 10 mg via ORAL
  Filled 2021-07-15 (×3): qty 1

## 2021-07-15 MED ORDER — INSULIN ASPART 100 UNIT/ML IJ SOLN
3.0000 [IU] | Freq: Three times a day (TID) | INTRAMUSCULAR | Status: DC
  Administered 2021-07-16 – 2021-07-17 (×5): 3 [IU] via SUBCUTANEOUS
  Filled 2021-07-15 (×5): qty 1

## 2021-07-15 MED ORDER — DEXTROSE IN LACTATED RINGERS 5 % IV SOLN: INTRAVENOUS | Status: DC

## 2021-07-15 MED ORDER — RIVAROXABAN 15 MG PO TABS
15.0000 mg | ORAL_TABLET | Freq: Every day | ORAL | Status: DC
  Administered 2021-07-15 – 2021-07-17 (×3): 15 mg via ORAL
  Filled 2021-07-15 (×4): qty 1

## 2021-07-15 MED ORDER — DILTIAZEM HCL ER COATED BEADS 240 MG PO CP24
240.0000 mg | ORAL_CAPSULE | ORAL | Status: DC
  Administered 2021-07-16 – 2021-07-18 (×3): 240 mg via ORAL
  Filled 2021-07-15 (×3): qty 1

## 2021-07-15 NOTE — Progress Notes (Signed)
Sugar came down to 363 with the IV push of insulin to be fluids.  We will hold off on insulin drip and give Semglee insulin this evening with sliding scale and start diet at this time. ? ?Dr. Alford Highland ?

## 2021-07-15 NOTE — ED Triage Notes (Signed)
Per EMS pt fell at home. Unknown reason for fall. BG 531. No hx of DM. Last week she felt weak but never called EMS ? ?HR 107 ?97% oxygen ?110/64 BP ?

## 2021-07-15 NOTE — Assessment & Plan Note (Addendum)
-   The patient was placed on an insulin drip. Her gap is closed. She is currently receiving Sub Q lantus and correction insulin.She is being educated on how to administer her own insulin. . She should be discharged to home with CGM. Reportedly the patient will have a friend who will be available to place CGM and administer her insulin ?

## 2021-07-15 NOTE — Assessment & Plan Note (Signed)
Chronic atrial fibrillation on Cardizem CD.  Xarelto for anticoagulation. ?

## 2021-07-15 NOTE — ED Provider Notes (Signed)
? ?[facility] ?Provider Note ? ? ? Event Date/Time  ? First MD Initiated Contact with Patient 07/15/21 1328   ?  (approximate) ? ? ?History  ? ?Fall and Hyperglycemia ? ? ?HPI ? ?Jennifer [name] is a 88 y.o. female who lives at home by herself reports she went to the bathroom and was standing in the doorway and fell backwards.  She is not sure why.  She reports she has been very thirsty and drinking a lot and urinating a lot for the last week.  Usually she is only been eating a meal a day. ? ? Active Diagnoses/Problem List ?Patient Active Problem List  ?Diagnosis  ? Hypertension associated with type 2 diabetes mellitus (CMS-HCC)  ? COPD (chronic obstructive pulmonary disease) (CMS-HCC)  ? GERD (gastroesophageal reflux disease)  ? Controlled type 2 diabetes mellitus with other specified complication, without long-term current use of insulin (CMS-HCC)  ? DDD (degenerative disc disease), lumbar  ? Osteoporosis, post-menopausal  ? Hyperlipidemia associated with type 2 diabetes mellitus (CMS-HCC)  ? Vitamin D deficiency  ? History of TIA (transient ischemic attack)  ? B12 deficiency  ? High risk medication use  ? Paroxysmal atrial fibrillation (CMS-HCC)  ? Venous insufficiency of both lower extremities  ? Right leg pain  ? Post-poliomyelitis muscular atrophy  ? S/p reverse total shoulder arthroplasty   ?Current Medications: ?Outpatient Medications Marked as Taking for the 06/05/21 encounter (Office Visit) with Mechele Collin, NP  ?Medication Sig Dispense Refill  ? acetaminophen (TYLENOL) 500 MG tablet Take 1,000 mg by mouth every 6 (six) hours as needed for Pain.  ? cyanocobalamin (VITAMIN B12) 1000 MCG tablet Take 1,000 mcg by mouth once daily  ? diltiazem (CARDIZEM CD) 240 MG CD capsule TAKE 1 CAPSULE ONCE DAILY FOR BLOOD PRESSURE AND HEART 90 capsule 3  ? lisinopriL (ZESTRIL) 20 MG tablet Take 1 tablet (20 mg total) by mouth once daily for Blood Pressure 90 tablet 1  ? omeprazole  (PRILOSEC) 20 MG DR capsule TAKE 1 CAPSULE DAILY, WITH SECOND DOSE IN THE EVENING AS NEEDED FOR REFLUX 180 capsule 3  ? simvastatin (ZOCOR) 10 MG tablet TAKE 1 TABLET NIGHTLY FOR CHOLESTEROL 90 tablet 1  ? VITAMIN D2 1,250 mcg (50,000 unit) capsule TAKE 1 CAPSULE EVERY 14 DAYS FOR VITAMIN D 7 capsule 3  ? XARELTO 20 mg tablet TAKE 1 TABLET DAILY WITH DINNER 90 tablet 3  ? ?Physical Exam  ? ?Triage Vital Signs: ?ED Triage Vitals [07/15/21 1327]  ?Enc Vitals Group  ?   BP   ?   Pulse   ?   Resp   ?   Temp   ?   Temp src   ?   SpO2   ?   Weight   ?   Height   ?   Head Circumference   ?   Peak Flow   ?   Pain Score 0  ?   Pain Loc   ?   Pain Edu?   ?   Excl. in GC?   ? ? ?Most recent vital signs: ?Vitals:  ? 07/15/21 1332 07/15/21 1358  ?BP: (!) 134/106   ?Pulse: (!) 106   ?Resp: 18   ?Temp:  98.9 ?F (37.2 ?C)  ?SpO2: 100%   ? ? ? ?General: Awake, no distress.  ?Head normocephalic atraumatic ?Eyes pupils equal round extraocular movements intact ?Neck is supple nontender ?Back is nontender ?CV:  Good peripheral perfusion.  Heart regular rate  and rhythm no audible murmur ?Resp:  Normal effort.  Lungs are clear ?Abd:  No distention.  Soft and nontender no organomegaly ?Extremities: Nontender no hip tenderness no tenderness on pelvic compression patient wears a foot support on the left leg. ? ? ?ED Results / Procedures / Treatments  ? ?Labs ?(all labs ordered are listed, but only abnormal results are displayed) ?Labs Reviewed  ?COMPREHENSIVE METABOLIC PANEL - Abnormal; Notable for the following components:  ?    Result Value  ? Sodium 130 (*)   ? Chloride 95 (*)   ? CO2 21 (*)   ? Glucose, Bld 626 (*)   ? Alkaline Phosphatase 188 (*)   ? GFR, Estimated 59 (*)   ? All other components within normal limits  ?LACTIC ACID, PLASMA - Abnormal; Notable for the following components:  ? Lactic Acid, Venous 2.3 (*)   ? All other components within normal limits  ?BLOOD GAS, VENOUS - Abnormal; Notable for the following components:  ?  pCO2, Ven 37 (*)   ? Acid-base deficit 2.2 (*)   ? All other components within normal limits  ?URINALYSIS, ROUTINE W REFLEX MICROSCOPIC - Abnormal; Notable for the following components:  ? Color, Urine STRAW (*)   ? APPearance CLEAR (*)   ? Glucose, UA >=500 (*)   ? Hgb urine dipstick MODERATE (*)   ? Ketones, ur 20 (*)   ? Bacteria, UA RARE (*)   ? All other components within normal limits  ?BETA-HYDROXYBUTYRIC ACID - Abnormal; Notable for the following components:  ? Beta-Hydroxybutyric Acid 3.78 (*)   ? All other components within normal limits  ?CBG MONITORING, ED - Abnormal; Notable for the following components:  ? Glucose-Capillary 551 (*)   ? All other components within normal limits  ?LIPASE, BLOOD  ?CBC WITH DIFFERENTIAL/PLATELET  ?LACTIC ACID, PLASMA  ?BASIC METABOLIC PANEL  ?BASIC METABOLIC PANEL  ?BASIC METABOLIC PANEL  ?BASIC METABOLIC PANEL  ?OSMOLALITY  ?HEMOGLOBIN A1C  ?CBG MONITORING, ED  ?CBG MONITORING, ED  ?TROPONIN I (HIGH SENSITIVITY)  ?TROPONIN I (HIGH SENSITIVITY)  ? ? ? ?EKG ?EKG read interpreted by me shows a A-fib at 112 slight left axis no acute ST-T wave changes A-fib appears to be new since January ? ? ? ?RADIOLOGY ?Chest x-ray read by radiology films reviewed by me did not show any acute changes there may be some bronchitic change. ? ? ?PROCEDURES: ? ?Critical Care performed: Critical care time 20 minutes.  This includes checking with the patient reviewing her old records and her current studies working on her hyperglycemia and discussing with the hospitalist. ? ?Procedures ? ? ?MEDICATIONS ORDERED IN ED: ?Medications  ?sodium chloride 0.9 % bolus 1,000 mL (has no administration in time range)  ?insulin aspart (novoLOG) injection 5 Units (has no administration in time range)  ?insulin regular, human (MYXREDLIN) 100 units/ 100 mL infusion (has no administration in time range)  ?lactated ringers bolus 1,106 mL (has no administration in time range)  ?lactated ringers infusion (has no  administration in time range)  ?dextrose 5 % in lactated ringers infusion (has no administration in time range)  ?dextrose 50 % solution 0-50 mL (has no administration in time range)  ?potassium chloride 10 mEq in 100 mL IVPB (has no administration in time range)  ?acetaminophen (TYLENOL) tablet 500-1,000 mg (has no administration in time range)  ?oxyCODONE (Oxy IR/ROXICODONE) immediate release tablet 2.5-5 mg (has no administration in time range)  ?diltiazem (CARDIZEM CD) 24 hr capsule 240 mg (has  no administration in time range)  ?lisinopril (ZESTRIL) tablet 20 mg (has no administration in time range)  ?simvastatin (ZOCOR) tablet 10 mg (has no administration in time range)  ?pantoprazole (PROTONIX) EC tablet 40 mg (has no administration in time range)  ?ondansetron (ZOFRAN) tablet 4 mg (has no administration in time range)  ?rivaroxaban (XARELTO) tablet 20 mg (has no administration in time range)  ?sodium chloride 0.9 % bolus 1,000 mL (1,000 mLs Intravenous New Bag/Given 07/15/21 1346)  ? ? ? ?IMPRESSION / MDM / ASSESSMENT AND PLAN / ED COURSE  ?I reviewed the triage vital signs and the nursing notes. ? ?Patient with history of diabetes here as I can tell this has been treated by diet from reviewing the old records.  She seems to have decompensated about last week developing polyuria and polydipsia.  She now has an elevated lactic acid a glucose of 636 and ketones in the urine beta hydroxybutyrate and blood.  She is likely quite dehydrated I been giving her fluids.  We will start some insulin as well once some fluid has gone him.  I have talked to the hospitalist about her we will get her in the hospital.  Patient that is really no sign of infection to explain her sudden decompensation. ? ? ?The patient is on the cardiac monitor to evaluate for evidence of arrhythmia and/or significant heart rate changes.  Nothing brought her previously known A-fib was seen. ? ? ?FINAL CLINICAL IMPRESSION(S) / ED DIAGNOSES  ? ?Final  diagnoses:  ?Fall, initial encounter  ?Syncope, unspecified syncope type  ?Hyperglycemia  ? ? ? ?Rx / DC Orders  ? ?ED Discharge Orders   ? ? None  ? ?  ? ? ? ?Note:  This document was prepared using Dragon v

## 2021-07-15 NOTE — Assessment & Plan Note (Signed)
Patient on lisinopril and Cardizem CD. ?

## 2021-07-15 NOTE — Assessment & Plan Note (Signed)
PT and OT evaluations. ?

## 2021-07-15 NOTE — ED Notes (Signed)
Glucose Done. 551 ?

## 2021-07-15 NOTE — H&P (Signed)
?History and Physical  ? ? ?Patient: Jennifer [name] [id number] DOB: 10/26/1931 ?DOA: 07/15/2021 ?DOS: the patient was seen and examined on 07/15/2021 ?PCP: Luciana Axeoward, Shannon H, NP  ?Patient coming from: Home ? ?Chief Complaint:  ?Chief Complaint  ?Patient presents with  ? Fall  ? Hyperglycemia  ? ?HPI: Jennifer [name] is a 88 y.o. 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  The patient states over the last week she has been very thirsty and using the bathroom every 30 minutes and having a dry mouth.  This morning she was not feeling quite well and lost her balance and fell backwards.  No loss of consciousness.  She was able to crawl and scoot into the other room and call for help.  She was unable to get up.  In the ER she was found to have a sugar of greater than 600 and elevated lactic acid level.  Hospitalist services were contacted for admission.  ER physician ordered an insulin drip. ? ?Review of Systems: Review of Systems  ?Constitutional:  Positive for malaise/fatigue. Negative for chills, fever and weight loss.  ?HENT:  Negative for sore throat.   ?Eyes:  Negative for double vision.  ?Respiratory:  Negative for cough.   ?Cardiovascular:  Negative for chest pain.  ?Gastrointestinal:  Negative for abdominal pain.  ?Genitourinary:  Positive for frequency and urgency.  ?Musculoskeletal:  Positive for joint pain.  ?Skin:  Negative for rash.  ?Neurological:  Positive for dizziness.  ?Psychiatric/Behavioral:  Negative for depression.    ?Past Medical History:  ?Diagnosis Date  ? Arthritis   ? Atrial fibrillation (HCC)   ? a.) CHA2DS2-VASc = 7 (age x 2, sex, HTN, TIA x 2, T2DM). b.) rate/rhythm maintained on oral diltiazem; chronically anticoagulated using rivaroxaban  ? B12 deficiency   ? Complication of anesthesia   ? a.) delayed emergence following kyphoplasty  ? COPD (chronic  obstructive pulmonary disease) (HCC)   ? DDD (degenerative disc disease), lumbar   ? Diastolic dysfunction 03/02/2016  ? a.)  TTE 03/02/2016: EF 65%; trivial MR; G1DD. b.)  TTE 11/20/2020: EF >55%; trivial MR, mild TR; G1DD.  ? Diverticulosis   ? GERD (gastroesophageal reflux disease)   ? History of kidney stones   ? HLD (hyperlipidemia)   ? Hypertension   ? Long term current use of anticoagulant   ? a.) Rivaroxaban  ? Post-menopausal osteoporosis   ? Post-poliomyelitis muscular atrophy   ? a.) LLE weakness; wears brace  ? T2DM (type 2 diabetes mellitus) (HCC)   ? TIA (transient ischemic attack)   ? Venous insufficiency of both lower extremities   ? Vitamin D deficiency   ? ?Past Surgical History:  ?Procedure Laterality Date  ? CATARACT EXTRACTION W/PHACO Left 05/30/2018  ? Procedure: CATARACT EXTRACTION PHACO AND INTRAOCULAR LENS PLACEMENT (IOC) LEFT;  Surgeon: Nevada CraneKing, Bradley Mark, MD;  [facility];  Service: Ophthalmology;  Laterality: Left;  ? FEMUR FRACTURE SURGERY Right 2010  ? HIP SURGERY Left   ? fx  ? KYPHOPLASTY    ? REVERSE SHOULDER ARTHROPLASTY Left 05/05/2021  ? Procedure: Left reverse shoulder arthroplasty, biceps tenodesis;  Surgeon: Signa KellPatel, Sunny, MD;  Location: [facility];  Service: Orthopedics;  Laterality: Left;  ? ?Social History:  reports that she has never smoked. She has never used smokeless tobacco. She reports that she does not drink alcohol and does not use drugs. ? ?Allergies  ?  Allergen Reactions  ? Esomeprazole Magnesium Diarrhea  ?  *Nexium   ? Nitrofurantoin   ?  "Flu"  ? ? ?Family History  ?Problem Relation Age of Onset  ? Stroke Mother   ? Pulmonary embolism Mother   ? Stroke Father   ? Diabetes Sister   ? Heart attack Sister   ? Cancer Brother   ? ? ?Prior to Admission medications   ?Medication Sig Start Date End Date Taking? Authorizing Provider  ?acetaminophen (TYLENOL) 500 MG tablet Take 1-2 tablets (500-1,000 mg total) by mouth every 8 (eight) hours as needed for mild  pain. 05/07/21   Anson Oregon, PA-C  ?diltiazem (CARDIZEM CD) 240 MG 24 hr capsule Take 240 mg by mouth every morning. 09/15/17   [provider]  ?lisinopril (PRINIVIL,ZESTRIL) 40 MG tablet Take 40 mg by mouth every morning. 02/25/15   [provider]  ?lisinopril (ZESTRIL) 20 MG tablet Take 20 mg by mouth daily. 06/05/21   [provider]  ?omeprazole (PRILOSEC) 20 MG capsule Take 20 mg by mouth every morning. Pt may take a 2nd dose in the evening if needed for acid reflux 03/08/15   [provider]  ?ondansetron (ZOFRAN) 4 MG tablet Take 1 tablet (4 mg total) by mouth every 6 (six) hours as needed for nausea. 05/07/21   Anson Oregon, PA-C  ?oxyCODONE (OXY IR/ROXICODONE) 5 MG immediate release tablet Take 0.5-1 tablets (2.5-5 mg total) by mouth every 4 (four) hours as needed for severe pain. 05/07/21   Anson Oregon, PA-C  ?rivaroxaban (XARELTO) 20 MG TABS tablet Take 1 tablet (20 mg total) by mouth daily with supper. 03/02/16   Auburn Bilberry, MD  ?simvastatin (ZOCOR) 10 MG tablet Take 10 mg by mouth at bedtime. 09/11/17   [provider]  ?Vitamin D, Ergocalciferol, (DRISDOL) 50000 units CAPS capsule Take 50,000 Units by mouth every 14 (fourteen) days. 03/08/15   [provider]  ? ? ?Physical Exam: ?Vitals:  ? 07/15/21 1329 07/15/21 1332 07/15/21 1358  ?BP:  (!) 134/106   ?Pulse:  (!) 106   ?Resp:  18   ?Temp:   98.9 ?F (37.2 ?C)  ?TempSrc:   Oral  ?SpO2:  100%   ?Weight: 55.3 kg    ?Height: 4\' 11"  (1.499 m)    ? ?Physical Exam ?HENT:  ?   Head: Normocephalic.  ?   Mouth/Throat:  ?   Pharynx: No oropharyngeal exudate.  ?Eyes:  ?   General: Lids are normal.  ?   Conjunctiva/sclera: Conjunctivae normal.  ?Cardiovascular:  ?   Rate and Rhythm: Normal rate and regular rhythm.  ?   Heart sounds: Normal heart sounds, S1 normal and S2 normal.  ?Pulmonary:  ?   Breath sounds: No decreased breath sounds, wheezing, rhonchi or rales.  ?Abdominal:  ?    Palpations: Abdomen is soft.  ?   Tenderness: There is no abdominal tenderness.  ?Musculoskeletal:  ?   Right lower leg: No swelling.  ?   Left lower leg: No swelling.  ?Skin: ?   General: Skin is warm.  ?   Findings: No rash.  ?Neurological:  ?   Mental Status: She is alert and oriented to person, place, and time.  ?   Comments: Patient unable to move left leg but does have feeling to light touch.  ?  ?Data Reviewed: ?Sodium 138, glucose 626, CO2 21, pH 7.39, alkaline phosphatase 188, GFR 59 with creatinine of 0.94, lactic acid 2.3, CBC  within normal limits, beta hydroxybutyric acid 3.78 ? ?Assessment and Plan: ?1.  Hyperosmolar coma secondary to type 2 diabetes mellitus.  Sugar above 600.  Will start insulin drip protocol.  Checking hemoglobin A1c.  Diabetic diet discussed.  Will need to be taught how to inject insulin once off insulin drip. ?2.  Hyponatremia.  Sodium is low secondary to high sugars.  This should improve once sugars better controlled. ?3.  Essential hypertension continue Cardizem CD and lisinopril. ?4.  Chronic atrial fibrillation continue Cardizem CD and Xarelto. ?5.  Recent shoulder surgery.  PT and OT consultations ?6.  Fall.  ER physician ordered CT scan of the head.  Results still pending.  I ordered a pelvis x-ray. ? ? ? Advance Care Planning:   Code Status: Full Code  ? ?Consults: None ? ?Family Communication: Spoke with main contact on the phone ? ?Severity of Illness: ?The appropriate patient status for this patient is INPATIENT. Inpatient status is judged to be reasonable and necessary in order to provide the required intensity of service to ensure the patient's safety. The patient's presenting symptoms, physical exam findings, and initial radiographic and laboratory data in the context of their chronic comorbidities is felt to place them at high risk for further clinical deterioration. Furthermore, it is not anticipated that the patient will be medically stable for discharge from the  hospital within 2 midnights of admission.  ? ?* I certify that at the point of admission it is my clinical judgment that the patient will require inpatient hospital care spanning beyond 2 midnights from the point o

## 2021-07-15 NOTE — Assessment & Plan Note (Addendum)
-  ?  Resolved. Sodium was low secondary to high sugars.  ?

## 2021-07-16 DIAGNOSIS — E1101 Type 2 diabetes mellitus with hyperosmolarity with coma: Secondary | ICD-10-CM | POA: Diagnosis not present

## 2021-07-16 LAB — BASIC METABOLIC PANEL
Anion gap: 5 (ref 5–15)
BUN: 13 mg/dL (ref 8–23)
CO2: 25 mmol/L (ref 22–32)
Calcium: 8.8 mg/dL — ABNORMAL LOW (ref 8.9–10.3)
Chloride: 109 mmol/L (ref 98–111)
Creatinine, Ser: 0.51 mg/dL (ref 0.44–1.00)
GFR, Estimated: >60 mL/min (ref >60)
Glucose, Bld: 184 mg/dL — ABNORMAL HIGH (ref 70–99)
Potassium: 3.9 mmol/L (ref 3.5–5.1)
Sodium: 139 mmol/L (ref 135–145)

## 2021-07-16 LAB — GLUCOSE, CAPILLARY
Glucose-Capillary: 257 mg/dL — ABNORMAL HIGH (ref 70–99)
Glucose-Capillary: 245 mg/dL — ABNORMAL HIGH (ref 70–99)

## 2021-07-16 LAB — CBG MONITORING, ED
Glucose-Capillary: 158 mg/dL — ABNORMAL HIGH (ref 70–99)
Glucose-Capillary: 127 mg/dL — ABNORMAL HIGH (ref 70–99)

## 2021-07-16 MED ORDER — INSULIN STARTER KIT- PEN NEEDLES (ENGLISH)
1.0000 | Freq: Once | Status: AC
  Administered 2021-07-16: 1
  Filled 2021-07-16: qty 1

## 2021-07-16 NOTE — ED Notes (Signed)
Messaged pharmacy for 0700 diltiazem to be sent. ?

## 2021-07-16 NOTE — Progress Notes (Signed)
Admission profile updated. ?

## 2021-07-16 NOTE — ED Notes (Signed)
PT was at bedside with pt.  ?

## 2021-07-16 NOTE — Progress Notes (Addendum)
Inpatient Diabetes Program Recommendations ? ?AACE/ADA: New Consensus Statement on Inpatient Glycemic Control (2015) ? ?Target Ranges:  Prepandial:   less than 140 mg/dL ?     Peak postprandial:   less than 180 mg/dL (1-2 hours) ?     Critically ill patients:  140 - 180 mg/dL  ? ? Latest Reference Range & Units 07/15/21 13:24 07/15/21 16:59 07/15/21 17:57 07/15/21 20:21 07/15/21 21:20 07/16/21 07:23 07/16/21 11:30  ?Glucose-Capillary 70 - 99 mg/dL 551 (HH) 363 (H) 289 (H) 372 (H) 369 (H) 158 (H) 127 (H)  ? ?Admit with: Fall/ Hyperglycemia ?  ?History: DM2 ?  ?Home DM Meds: None--[name] controlled ?  ?Current Orders: Semglee 20 units Q1800 ?                          Novolog Sensitive Correction Scale/ SSI (0-9 units) TID AC + HS ?                          Novolog 3 units TID with meals ?  ?  ?  ?CBGs much improved after fluid hydration Jennifer start of [name] ?  ?Looks like Lantus [name] Pen Jennifer Novolog [name] pens are covered Jennifer preferred by Jennifer's insurance coverage ? ?Met w/ Jennifer at bedside down in the ED around 11:20am.  Jennifer [name].  PCP checks glucose on labs but Jennifer not checking CBGs at home.  Had symptoms of high glucose at home Jennifer thought about calling her PCP but stated to me she'd Jennifer a good day where she felt OK followed by a day where she felt bad.  Was having blurry vision as [name].  Jennifer me she has a lady that stays with her at night.  Also, Jennifer me she has asked her friend Jennifer [name] (phone number [phone]) to start staying with her during the day (Ms. Jennifer [name] apparently was staying with Jennifer during the day after her shoulder surgery--Also, Ms. Jennifer [name] has Type 1 diabetes Jennifer uses [name] at home.   ? ?Jennifer [name], Jennifer [name], Jennifer [name], Jennifer [name].  Explained what an A1C is (A1c pending), basic pathophysiology of DM Type 2, basic home care, basic diabetes [name]  nutrition principles, importance of checking CBGs Jennifer maintaining good CBG control to prevent long-term Jennifer short-term complications.  Reviewed signs Jennifer symptoms of hyperglycemia Jennifer hypoglycemia Jennifer how to treat hypoglycemia at home.  Also reviewed blood sugar goals Jennifer A1c goals for home.   ? ?Jennifer [name].  Jennifer [name], Jennifer [name].  Jennifer [name]. ? ?Jennifer [name].  Jennifer stated she was nervous about using [name] at home but would use [name] if needed to make her feel better.  Jennifer me her friend Jennifer [name] uses [name] Jennifer that Jennifer [name] has agreed to stay with her during the day Jennifer will be able to help Jennifer take [name] if needed.  I called Ms. Jennifer [name] Jennifer gave her an update on Jennifer [name] (per Jennifer's request).  Jennifer with Ms. Jennifer [name] that Jennifer may need [name] ay home--Ms. Jennifer [name] stated she currently uses an [name] pump at home but has lots of experience giving [name] SQ as  she has had Type 1 diabetes since her 20s--Ms. Jennifer [name] expressed her willingness to help Jennifer at home with injections.  Also called Jennifer's granddaughter Jennifer [name] at Jennifer's request to giver her an update.  Also spoke w/ Jennifer's granddaughter Jennifer [name] [phone]).  Reviewed all of the above Jennifer below info with her as [name]. ? ?Educated [name] on [name] pen use at home.  Stressed to Jennifer using her stronger R hand to manipulate the pen Jennifer needle Jennifer use her weaker L hand to hold the pen.  Reviewed all steps of [name] pen including attachment of needle, 2-unit air shot, dialing up dose, giving injection, rotation of injection sites, removing needle, disposal of sharps, storage of unused [name], disposal of [name] etc.  [name] able to provide successful return demonstration.  Reviewed troubleshooting with [name] pen.   ? ?Jennifer expressed interest in starting a Freestyle Libre CGM for  home since her friend Jennifer [name] also uses a Freestyle.  Confirmed with Jennifer [name] that she does indeed use a Freestyle.  Jennifer [name] Jennifer me she would be willing to help Jennifer [name] change her CGM every 2 weeks if MD allows CGM to be started prior to d/c. ? ? ? ?--Will follow [name] during hospitalization-- ? ?Jennifer Quaker RN, MSN, CDE ?Diabetes Coordinator ?Inpatient Glycemic Control Team ?Team Pager: [phone] (8a-5p) ? ? ? ? ? ?

## 2021-07-16 NOTE — ED Notes (Signed)
Pt awake, helped to reposition in bed, breakfast tray provided. ?

## 2021-07-16 NOTE — Evaluation (Signed)
Occupational Therapy Evaluation ?Patient Details ?Name: Jennifer [name] ?MRN: [id number] ?DOB: Aug 13, 1931 ?Today's Date: 07/16/2021 ? ? ?History of Present Illness Jennifer [name] is a 88 y.o. 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 Admitted for fall 08/04/21 and in the ER she was found to have a sugar of greater than 600 and elevated lactic acid level.  ? ?Clinical Impression ?  ?Jennifer [name] was seen for OT/PT co- evaluation this date, +2 assist for lines/leads mgmt. Prior to hospital admission, pt was MOD I using Hemiwalker for mobility and ADLs. Pt lives alone in level entry house, reports friend spends the nights from 6pm-8am and assists with meals/sponge bathing). Pt presents to acute OT demonstrating impaired ADL performance and functional mobility 2/2 functional strength/ROM/balance deficits.  ? ?Pt currently requires SETUP don B socks and L AFO seated EOB, CGA + single UE support to adjust AFO straps in standing. CGA + RW sit<>stand and ~100 ft mobility. Pt would benefit from skilled OT to address noted impairments and functional limitations (see below for any additional details). Upon hospital discharge, recommend HHOT to maximize pt safety and return to PLOF.   ? ?Recommendations for follow up therapy are one component of a multi-disciplinary discharge planning process, led by the attending physician.  Recommendations may be updated based on patient status, additional functional criteria and insurance authorization.  ? ?Follow Up Recommendations ? Home health OT  ?  ?Assistance Recommended at Discharge Intermittent Supervision/Assistance  ?Patient can return home with the following A little help with walking and/or transfers;A little help with bathing/dressing/bathroom;Help with stairs or ramp for entrance ? ?  ?Functional Status Assessment ? Patient has had a recent decline in their  functional status and demonstrates the ability to make significant improvements in function in a reasonable and predictable amount of time.  ?Equipment Recommendations ? BSC/3in1  ?  ?Recommendations for Other Services   ? ? ?  ?Precautions / Restrictions Precautions ?Precautions: Fall ?Restrictions ?Weight Bearing Restrictions: Yes ?Other Position/Activity Restrictions: L TSA on 05/05/2021. Per Jennifer [name] 06/26/2021 note in Care Everywhere: "Can continue activities of daily living. Avoid pushing or pulling behind the body."  ? ?  ? ?Mobility Bed Mobility ?Overal bed mobility: Modified Independent ?  ?  ?  ?  ?  ?  ?  ?  ? ?Transfers ?Overall transfer level: Needs assistance ?Equipment used: Rolling walker (2 wheels) ?Transfers: Sit to/from Stand ?Sit to Stand: Min guard ?  ?  ?  ?  ?  ?General transfer comment: improved with use of RW ?  ? ?  ?Balance Overall balance assessment: Needs assistance ?Sitting-balance support: No upper extremity supported, Feet supported ?Sitting balance-Leahy Scale: Good ?  ?  ?Standing balance support: Single extremity supported, During functional activity ?Standing balance-Leahy Scale: Fair ?  ?  ?  ?  ?  ?  ?  ?  ?  ?  ?  ?  ?   ? ?ADL either performed or assessed with clinical judgement  ? ?ADL Overall ADL's : Needs assistance/impaired ?  ?  ?  ?  ?  ?  ?  ?  ?  ?  ?  ?  ?  ?  ?  ?  ?  ?  ?  ?General ADL Comments: SETUP don L AFO seated EOB, CGA + single UE support to adjust AFO straps in standing. CGA + RW for ADL t/f.  ? ? ? ? ?  Pertinent Vitals/Pain Pain Assessment ?Pain Assessment: No/denies pain  ? ? ? ?Hand Dominance   ?  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  ?Lower Extremity Assessment ?Lower Extremity Assessment: Overall WFL for tasks assessed (has chronic drop foot on left, wears AFO) ?  ?Cervical / Trunk Assessment ?Cervical /  Trunk Assessment: Normal ?  ?Communication Communication ?Communication: No difficulties;Other (comment) (tangential with speech) ?  ?Cognition Arousal/Alertness: Awake/alert ?Behavior During Therapy: [facility] for tasks assessed/performed ?Overall Cognitive Status: Within Functional Limits for tasks assessed ?  ?  ?  ?  ?  ?  ?  ?  ?  ?  ?  ?  ?  ?  ?  ?  ?  ?  ?  ?General Comments  HR 115 with mobility ? ?  ?   ?   ? ? ?Home Living Family/patient expects to be discharged to:: Private residence ?Living Arrangements: Alone ?Available Help at Discharge: Friend(s);Available PRN/intermittently ?Type of Home: House ?Home Access: Level entry ?  ?  ?Home Layout: One level ?  ?  ?Bathroom Shower/Tub: Tub/shower unit ?  ?  ?  ?  ?Home Equipment: Conservation officer, nature (2 wheels);Other (comment) (hemiwalker) ?  ?Additional Comments: reports friend spends the night (6pm - 8am) and assists with meals/spongebathing. ?  ? ?  ?Prior Functioning/Environment Prior Level of Function : Needs assist ?  ?  ?  ?  ?  ?  ?Mobility Comments: HW and AFO for household mobility, reports doing outside ?ADLs Comments: needs assistance ?  ? ?  ?  ?OT Problem List: Decreased strength;Decreased range of motion;Decreased activity tolerance;Impaired balance (sitting and/or standing);Decreased safety awareness ?  ?   ?OT Treatment/Interventions: Self-care/ADL training;Therapeutic exercise;Energy conservation;DME and/or AE instruction;Therapeutic activities;Patient/family education;Balance training  ?  ?OT Goals(Current goals can be found in the care plan section) Acute Rehab OT Goals ?Patient Stated Goal: to go home ?OT Goal Formulation: With patient ?Time For Goal Achievement: 07/30/21 ?Potential to Achieve Goals: Good ?ADL Goals ?Pt Will Perform Grooming: with modified independence;standing ?Pt Will Perform Lower Body Dressing: with modified independence;sit to/from stand ?Pt Will Transfer to Toilet: with modified independence;ambulating;regular height  toilet ?Additional ADL Goal #1: Pt will ID and impement x3 falls prevention strategies  ?OT Frequency: Min 2X/week ?  ? ?Co-evaluation PT/OT/SLP Co-Evaluation/Treatment: Yes ?Reason for Co-Treatment: For patient/therapist safety ?PT goals addressed during session: Mobility/safety with mobility ?OT goals addressed during session: ADL's and self-care ?  ? ?  ?AM-PAC OT "6 Clicks" Daily Activity     ?Outcome Measure Help from another person eating meals?: None ?Help from another person taking care of personal grooming?: A Little ?Help from another person toileting, which includes using toliet, bedpan, or urinal?: A Little ?Help from another person bathing (including washing, rinsing, drying)?: A Little ?Help from another person to put on and taking off regular upper body clothing?: A Little ?Help from another person to put on and taking off regular lower body clothing?: A Little ?6 Click Score: 19 ?  ?End of Session Equipment Utilized During Treatment: Rolling walker (2 wheels);Gait belt ?Nurse Communication: Mobility status ? ?Activity Tolerance: Patient tolerated treatment well ?Patient left: in bed;with call bell/phone within reach;with bed alarm set ? ?OT Visit Diagnosis: Other abnormalities of gait and mobility (R26.89)  ?              ?Time: [id number] ?OT Time Calculation (  min): 22 min ?Charges:  OT General Charges ?$OT Visit: 1 Visit ?OT Evaluation ?$OT Eval Moderate Complexity: 1 Mod ? ?Dessie Coma, M.S. OTR/L  ?07/16/21, 10:02 AM  ?ascom [phone] ? ?

## 2021-07-16 NOTE — Progress Notes (Addendum)
Inpatient Diabetes Program Recommendations ? ?AACE/ADA: New Consensus Statement on Inpatient Glycemic Control (2015) ? ?Target Ranges:  Prepandial:   less than 140 mg/dL ?     Peak postprandial:   less than 180 mg/dL (1-2 hours) ?     Critically ill patients:  140 - 180 mg/dL  ? ? Latest Reference Range & Units 07/15/21 13:28  ?Sodium 135 - 145 mmol/L 130 (L)  ?Potassium 3.5 - 5.1 mmol/L 4.7  ?Chloride 98 - 111 mmol/L 95 (L)  ?CO2 22 - 32 mmol/L 21 (L)  ?Glucose 70 - 99 mg/dL 574 (HH)  ?BUN 8 - 23 mg/dL 22  ?Creatinine 0.44 - 1.00 mg/dL 7.34  ?Calcium 8.9 - 10.3 mg/dL 03.7  ?Anion gap 5 - 15  14  ? ? Latest Reference Range & Units 07/15/21 13:24 07/15/21 16:59 07/15/21 17:57 07/15/21 20:21 07/15/21 21:20  ?Glucose-Capillary 70 - 99 mg/dL 096 (HH) 438 (H) ? ?5 units Novolog ? 289 (H) 372 (H) ? ? ? ?20 units Semglee 369 (H) ? ?5 units Novolog ?  ? ? Latest Reference Range & Units 07/16/21 07:23  ?Glucose-Capillary 70 - 99 mg/dL 381 (H) ? ?5 units Novolog ?  ? ? ? ?Admit with: Fall/ Hyperglycemia ? ?History: DM2 ? ?Home DM Meds: None--Diet controlled ? ?Current Orders: Semglee 20 units Q1800 ?    Novolog Sensitive Correction Scale/ SSI (0-9 units) TID AC + HS ?    Novolog 3 units TID with meals ? ? ? ?CBGs much improved after fluid hydration and start of Insulin ? ?Looks like Lantus Insulin Pen and Novolog Insulin pens are covered and preferred by pt's insurance coverage ? ?Will speak with pt today ? ? ? ?--Will follow patient during hospitalization-- ? ?Ambrose Finland RN, MSN, CDE ?Diabetes Coordinator ?Inpatient Glycemic Control Team ?Team Pager: [phone] (8a-5p) ? ?

## 2021-07-16 NOTE — Evaluation (Signed)
Physical Therapy Evaluation ?Patient Details ?Name: Jennifer [name] ?MRN: [id number] ?DOB: 08-Jun-1931 ?Today's Date: 07/16/2021 ? ?History of Present Illness ? Jennifer [name] is a 88 y.o. 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 Admitted for fall 08/04/21 and in the ER she was found to have a sugar of greater than 600 and elevated lactic acid level. ?  ?Clinical Impression ? Patient received in bed, she is agreeable to assessment. Patient is mod independent with bed mobility, transfers with min A and ambulated with min guard and RW 200 feet. Plan to follow up with ortho MD to clarify if she can progress to walker or not.  She will continue to benefit from skilled PT while here to improve mobility, balance and independence.    ?   ? ?Recommendations for follow up therapy are one component of a multi-disciplinary discharge planning process, led by the attending physician.  Recommendations may be updated based on patient status, additional functional criteria and insurance authorization. ? ?Follow Up Recommendations Home health PT ? ?  ?Assistance Recommended at Discharge Intermittent Supervision/Assistance  ?Patient can return home with the following ? A little help with walking and/or transfers;A little help with bathing/dressing/bathroom;Assist for transportation;Help with stairs or ramp for entrance;Assistance with cooking/housework ? ?  ?Equipment Recommendations None recommended by PT  ?Recommendations for Other Services ?    ?  ?Functional Status Assessment Patient has had a recent decline in their functional status and demonstrates the ability to make significant improvements in function in a reasonable and predictable amount of time.  ? ?  ?Precautions / Restrictions Precautions ?Precautions: Fall ?Restrictions ?Weight Bearing Restrictions: Yes ?Other Position/Activity Restrictions:  L TSA on 05/05/2021. Per Dr Allena Katz 06/26/2021 note in Care Everywhere: "Can continue activities of daily living. Avoid pushing or pulling behind the body."  ? ?  ? ?Mobility ? Bed Mobility ?Overal bed mobility: Modified Independent ?  ?  ?  ?  ?  ?  ?  ?  ? ?Transfers ?Overall transfer level: Needs assistance ?Equipment used: 1 person hand held assist ?Transfers: Sit to/from Stand ?Sit to Stand: Min assist ?  ?  ?  ?  ?  ?  ?  ? ?Ambulation/Gait ?Ambulation/Gait assistance: Min guard, Min assist ?Gait Distance (Feet): 200 Feet ?Assistive device: Rolling walker (2 wheels) ?Gait Pattern/deviations: Step-to pattern, Decreased step length - right, Decreased step length - left, Decreased stride length ?Gait velocity: decr ?  ?  ?General Gait Details: patient initially ambulated with HHA as she reported she was using hemiwalker she required min A and was not very stable. patient switched to RW with instruction to use L UE lightly on walker for balance ? ?Stairs ?  ?  ?  ?  ?  ? ?Wheelchair Mobility ?  ? ?Modified Rankin (Stroke Patients Only) ?  ? ?  ? ?Balance Overall balance assessment: Needs assistance, History of Falls ?Sitting-balance support: Feet supported ?Sitting balance-Leahy Scale: Good ?  ?  ?Standing balance support: Bilateral upper extremity supported, During functional activity, Reliant on assistive device for balance ?Standing balance-Leahy Scale: Fair ?Standing balance comment: needs UE support ?  ?  ?  ?  ?  ?  ?  ?  ?  ?  ?  ?   ? ? ? ?Pertinent Vitals/Pain Pain Assessment ?Pain Assessment: No/denies pain  ? ? ?Home Living Family/patient expects to be discharged to:: Private residence ?Living  Arrangements: Alone ?Available Help at Discharge: Friend(s);Available PRN/intermittently ?Type of Home: House ?Home Access: Level entry ?  ?  ?  ?Home Layout: One level ?Home Equipment: Agricultural consultant (2 wheels);Other (comment) (hemiwalker) ?Additional Comments: reports friend spends the night (6pm - 8am) and assists  with meals/spongebathing.  ?  ?Prior Function Prior Level of Function : Needs assist ?  ?  ?  ?  ?  ?  ?Mobility Comments: HW and AFO for household mobility, reports doing outside ?ADLs Comments: needs assistance ?  ? ? ?Hand Dominance  ?   ? ?  ?Extremity/Trunk Assessment  ? Upper Extremity Assessment ?Upper Extremity Assessment: Defer to OT evaluation ?  ? ?Lower Extremity Assessment ?Lower Extremity Assessment: Overall WFL for tasks assessed (has chronic drop foot on left, wears AFO) ?  ? ?Cervical / Trunk Assessment ?Cervical / Trunk Assessment: Normal  ?Communication  ? Communication: No difficulties;Other (comment) (tangential with speech)  ?Cognition Arousal/Alertness: Awake/alert ?Behavior During Therapy: [facility] for tasks assessed/performed ?Overall Cognitive Status: Within Functional Limits for tasks assessed ?  ?  ?  ?  ?  ?  ?  ?  ?  ?  ?  ?  ?  ?  ?  ?  ?  ?  ?  ? ?  ?General Comments   ? ?  ?Exercises    ? ?Assessment/Plan  ?  ?PT Assessment Patient needs continued PT services  ?PT Problem List Decreased strength;Decreased mobility;Decreased activity tolerance;Decreased balance;Decreased knowledge of precautions;Decreased safety awareness ? ?   ?  ?PT Treatment Interventions DME instruction;Therapeutic exercise;Gait training;Balance training;Functional mobility training;Therapeutic activities;Patient/family education   ? ?PT Goals (Current goals can be found in the Care Plan section)  ?Acute Rehab PT Goals ?Patient Stated Goal: return home with assist and home health ?PT Goal Formulation: With patient ?Time For Goal Achievement: 07/30/21 ?Potential to Achieve Goals: Good ? ?  ?Frequency Min 2X/week ?  ? ? ?Co-evaluation   ?Reason for Co-Treatment: For patient/therapist safety ?PT goals addressed during session: Mobility/safety with mobility ?OT goals addressed during session: ADL's and self-care ?  ? ? ?  ?AM-PAC PT "6 Clicks" Mobility  ?Outcome Measure Help needed turning from your back to your side while  in a flat bed without using bedrails?: A Little ?Help needed moving from lying on your back to sitting on the side of a flat bed without using bedrails?: A Little ?Help needed moving to and from a bed to a chair (including a wheelchair)?: A Little ?Help needed standing up from a chair using your arms (e.g., wheelchair or bedside chair)?: A Little ?Help needed to walk in hospital room?: A Little ?Help needed climbing 3-5 steps with a railing? : A Lot ?6 Click Score: 17 ? ?  ?End of Session Equipment Utilized During Treatment: Gait belt ?Activity Tolerance: Patient tolerated treatment well ?Patient left: in bed;with call bell/phone within reach;with bed alarm set ?Nurse Communication: Mobility status ?PT Visit Diagnosis: Unsteadiness on feet (R26.81);Repeated falls (R29.6);Other abnormalities of gait and mobility (R26.89);Difficulty in walking, not elsewhere classified (R26.2) ?  ? ?Time: 7989-2119 ?PT Time Calculation (min) (ACUTE ONLY): 22 min ? ? ?Charges:   PT Evaluation ?$PT Eval Moderate Complexity: 1 Mod ?  ?  ?   ? ? ?Lissa Merlin, PT, GCS ?07/16/21,10:03 AM ? ? ?

## 2021-07-16 NOTE — Progress Notes (Signed)
?PROGRESS NOTE ? ?[name] [id number] DOB: 1931/08/18 DOA: 07/15/2021 ?PCP: Latanya Maudlin, NP ? ?Brief History   ?Jennifer [name] is a 88 y.o. 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  The patient states over the last week she has been very thirsty and using the bathroom every 30 minutes and having a dry mouth.  This morning she was not feeling quite well and lost her balance and fell backwards.  No loss of consciousness.  She was able to crawl and scoot into the other room and call for help.  She was unable to get up.  In the ER she was found to have a sugar of greater than 600 and elevated lactic acid level.  Hospitalist services were contacted for admission.  ER physician ordered an insulin drip. ? ?[facility] were consulted to admit the patient for further evaluation and treatment.  ? ?It was discontinued last night. The patient has been controlled on lantus and as needed correction insulin. She is being educated on self-administration of insulin.  ? ?Consultants  ?None ? ?Procedures  ?None ? ?Antibiotics  ? ?Anti-infectives (From admission, onward)  ? ? None  ? ?  ? ?Interval History/Subjective  ?The patient is resting quietly. No new complaints. ? ?Objective  ? ?Vitals:  ?Vitals:  ? 07/16/21 1400 07/16/21 1459  ?BP: (!) 116/94 (!) 123/50  ?Pulse: 83 66  ?Resp: (!) 22 19  ?Temp:  98.1 ?F (36.7 ?C)  ?SpO2: 98% 98%  ? ? ?Exam: ? ?Constitutional:  ?The patient is awake, alert, and oriented x 3. No acute distress. ? ?Respiratory:  ?No increased work of breathing. ?No wheezes, rales, or rhonchi ?No tactile fremitus ?Cardiovascular:  ?Regular rate and rhythm ?No murmurs, ectopy, or gallups. ?No lateral PMI. No thrills. ?Abdomen:  ?Abdomen is soft, non-tender, non-distended ?No hernias, masses, or organomegaly ?Normoactive bowel sounds.  ?Musculoskeletal:  ?No  cyanosis, clubbing, or edema ?Skin:  ?No rashes, lesions, ulcers ?palpation of skin: no induration or nodules ?Neurologic:  ?CN 2-12 intact ?Sensation all 4 extremities intact ?Psychiatric:  ?Mental status ?Mood, affect appropriate ?Orientation to person, place, time  ?judgment and insight appear intact ? ? ?I have personally reviewed the following:  ? ?Today's Data  ?Vitals ? ?Lab Data  ?BMP ? ?Micro Data  ? ? ?Imaging  ?CT Head ?X-ray of the pelvis ? ?Cardiology Data  ?EKG ? ?Other Data  ? ? ?Scheduled Meds: ? diltiazem  240 mg Oral BH-q7a  ? insulin aspart  0-5 Units Subcutaneous QHS  ? insulin aspart  0-9 Units Subcutaneous TID WC  ? insulin aspart  3 Units Subcutaneous TID WC  ? insulin glargine-yfgn  20 Units Subcutaneous QPM  ? lisinopril  20 mg Oral Daily  ? pantoprazole  40 mg Oral Daily  ? Rivaroxaban  15 mg Oral Q supper  ? simvastatin  10 mg Oral QHS  ? ?Continuous Infusions: ? sodium chloride 40 mL/hr at 07/16/21 1457  ? ? ?Principal Problem: ?  Hyperosmolar coma due to type 2 diabetes mellitus ([place]) ?Active Problems: ?  Hyponatremia ?  A-fib ([place]) ?  S/p reverse total shoulder arthroplasty ?  Essential hypertension ? ? LOS: 1 day  ? ?A & P  ?1.  Hyperosmolar coma secondary to type 2 diabetes mellitus.  Sugar above 600.  The patient was placed on an insulin drip. Her gap is closed. She is currently receiving  Sub Q lantus and correction insulin.She is being educated on how to administer her own insulin. ? ?2.  Hyponatremia.  Sodium is low secondary to high sugars.  This should improve once sugars better controlled. Corrected. ? ?3.  Essential hypertension continue Cardizem CD and lisinopril. The patient is normotensive. ? ?4.  Chronic atrial fibrillation continue Cardizem CD and Xarelto. Heart rate is currently controlled. ? ?5.  Recent shoulder surgery.  PT and OT consultations ? ?6.  Fall.  ER physician ordered CT scan of the head.  Results still pending.  I ordered a pelvis x-ray. ?  ?I have seen and  examined this patient myself. I have spent 34 minutes in her evaluation and care. ? ?DVT prophylaxis: Xarelto ?Code Status: Full Code ?Family Communication: None available ?Disposition Plan: home  ? ? ?Beckett Hickmon, DO ?[facility] ?Direct contact: see [email or web address]  ?7PM-7AM contact night coverage as above ?07/16/2021, 5:29 PM  LOS: 1 day  ? ? ? ? ? ?  ?

## 2021-07-16 NOTE — ED Notes (Signed)
Meal tray at bedside. ? ?Diabetes educator has been at bedside with pt. ?

## 2021-07-17 DIAGNOSIS — E1101 Type 2 diabetes mellitus with hyperosmolarity with coma: Secondary | ICD-10-CM | POA: Diagnosis not present

## 2021-07-17 LAB — GLUCOSE, CAPILLARY
Glucose-Capillary: 108 mg/dL — ABNORMAL HIGH (ref 70–99)
Glucose-Capillary: 308 mg/dL — ABNORMAL HIGH (ref 70–99)
Glucose-Capillary: 145 mg/dL — ABNORMAL HIGH (ref 70–99)
Glucose-Capillary: 95 mg/dL (ref 70–99)
Glucose-Capillary: 250 mg/dL — ABNORMAL HIGH (ref 70–99)

## 2021-07-17 LAB — HEMOGLOBIN A1C
Hgb A1c MFr Bld: 13.7 % — ABNORMAL HIGH (ref 4.8–5.6)
Mean Plasma Glucose: 346 mg/dL

## 2021-07-17 MED ORDER — BISACODYL 10 MG RE SUPP
10.0000 mg | Freq: Once | RECTAL | Status: AC
  Administered 2021-07-17: 10 mg via RECTAL
  Filled 2021-07-17: qty 1

## 2021-07-17 MED ORDER — PROCHLORPERAZINE EDISYLATE 10 MG/2ML IJ SOLN
10.0000 mg | Freq: Four times a day (QID) | INTRAMUSCULAR | Status: DC | PRN
  Administered 2021-07-17: 10 mg via INTRAVENOUS
  Filled 2021-07-17 (×2): qty 2

## 2021-07-17 MED ORDER — ONDANSETRON HCL 4 MG/2ML IJ SOLN
4.0000 mg | Freq: Once | INTRAMUSCULAR | Status: AC
  Administered 2021-07-17: 4 mg via INTRAVENOUS
  Filled 2021-07-17: qty 2

## 2021-07-17 MED ORDER — LACTULOSE 10 GM/15ML PO SOLN
20.0000 g | Freq: Three times a day (TID) | ORAL | Status: DC
  Administered 2021-07-17 – 2021-07-18 (×2): 20 g via ORAL
  Filled 2021-07-17 (×2): qty 30

## 2021-07-17 NOTE — Progress Notes (Signed)
Inpatient Diabetes Program Recommendations ? ?AACE/ADA: New Consensus Statement on Inpatient Glycemic Control (2015) ? ?Target Ranges:  Prepandial:   less than 140 mg/dL ?     Peak postprandial:   less than 180 mg/dL (1-2 hours) ?     Critically ill patients:  140 - 180 mg/dL  ? ?Lab Results  ?Component Value Date  ? GLUCAP 308 (H) 07/17/2021  ? HGBA1C 13.7 (H) 07/15/2021  ? ? ?Went to pts room to review insulin administration and take the [employer] CGM. Pt reports inability to concentrate on insulin reinforcement due to abd pain. Pt in room with nurse. Sent secure chat to Dr. Benny Lennert to inform of pain and status of education reinforcement. RN offered prune juice to pt. Will follow up first thing in the morning if not later today. ? ?Thanks, ? ?Tama Headings RN, MSN, BC-ADM ?Inpatient Diabetes Coordinator ?Team Pager [phone] (8a-5p) ? ? ?

## 2021-07-17 NOTE — Progress Notes (Signed)
Mobility Specialist - Progress Note ? ? 07/17/21 1100  ?Mobility  ?Activity Ambulated with assistance in hallway  ?Level of Assistance Standby assist, set-up cues, supervision of patient - no hands on  ?Assistive Device Front wheel walker  ?Distance Ambulated (ft) 180 ft  ?Activity Response Tolerated well  ?$Mobility charge 1 Mobility  ? ? ? ?Pt sitting in recliner upon arrival, utilizing RA. Pt stood to RW with minA; reminder for light use on LUE. AFO donned on LLE. Ambulated in hallway with supervision. No LOB. Denied SOB and dizziness. Pt does report occasional blurred vision post-session, believes it to be d/t her diabetes. RN made aware. O2 97%, HR 117-138 during ambulation with 38 RR. Pt returned to chair with alarm set, needs in reach.  ? ? ?Filiberto Pinks ?Mobility Specialist ?07/17/21, 11:39 AM ? ? ? ? ?

## 2021-07-17 NOTE — Plan of Care (Signed)
  Problem: Education: Goal: Knowledge of General Education information will improve Description: Including pain rating scale, medication(s)/side effects and non-pharmacologic comfort measures Outcome: Progressing   Problem: Health Behavior/Discharge Planning: Goal: Ability to manage health-related needs will improve Outcome: Progressing   Problem: Activity: Goal: Risk for activity intolerance will decrease Outcome: Progressing   

## 2021-07-17 NOTE — Progress Notes (Signed)
Physical Therapy Treatment ?Patient Details ?Name: Jennifer [name] ?MRN: [id number] ?DOB: [birth year] ?Today's Date: 07/17/2021 ? ? ?History of Present Illness Jennifer [name] is a 88 y.o. 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 Admitted for fall 08/04/21 and in the ER she was found to have a sugar of greater than 600 and elevated lactic acid level. ? ?  ?PT Comments  ? ? Patient received in bed, she feels like she may have wet bed. She is agreeable to PT session. Patient is mod independent with bed mobility. Transfers with min guard from bed, min A from recliner. Patient ambulated 300 feet with RW and supervision. Good pace, good balance. She will continue to benefit from skilled PT while here to improve independence and safety.  ?   ?Recommendations for follow up therapy are one component of a multi-disciplinary discharge planning process, led by the attending physician.  Recommendations may be updated based on patient status, additional functional criteria and insurance authorization. ? ?Follow Up Recommendations ? Home health PT ?  ?  ?Assistance Recommended at Discharge Intermittent Supervision/Assistance  ?Patient can return home with the following A little help with walking and/or transfers;A little help with bathing/dressing/bathroom;Assist for transportation;Help with stairs or ramp for entrance;Assistance with cooking/housework ?  ?Equipment Recommendations ? None recommended by PT  ?  ?Recommendations for Other Services   ? ? ?  ?Precautions / Restrictions Precautions ?Precautions: Fall ?Restrictions ?Other Position/Activity Restrictions: L TSA on 05/05/2021. Per Dr Allena Katz 06/26/2021 note in Care Everywhere: "Can continue activities of daily living. Avoid pushing or pulling behind the body."  ?  ? ?Mobility ? Bed Mobility ?Overal bed mobility: Modified Independent ?  ?  ?  ?  ?  ?   ?  ?  ? ?Transfers ?Overall transfer level: Needs assistance ?Equipment used: 1 person hand held assist ?Transfers: Sit to/from Stand ?Sit to Stand: Min assist ?  ?  ?  ?  ?  ?General transfer comment: able to stand from bed without difficulty, but required min A to stand from recliner. ?  ? ?Ambulation/Gait ?Ambulation/Gait assistance: Supervision ?Gait Distance (Feet): 300 Feet ?Assistive device: Rolling walker (2 wheels) ?Gait Pattern/deviations: Step-through pattern ?Gait velocity: WFL ?  ?  ?General Gait Details: light L UE support on walker for balance, supervision ? ? ?Stairs ?  ?  ?  ?  ?  ? ? ?Wheelchair Mobility ?  ? ?Modified Rankin (Stroke Patients Only) ?  ? ? ?  ?Balance Overall balance assessment: Modified Independent ?Sitting-balance support: Feet supported ?Sitting balance-Leahy Scale: Good ?  ?  ?Standing balance support: Bilateral upper extremity supported, During functional activity, Reliant on assistive device for balance ?Standing balance-Leahy Scale: Fair ?Standing balance comment: needs UE support ?  ?  ?  ?  ?  ?  ?  ?  ?  ?  ?  ?  ? ?  ?Cognition Arousal/Alertness: Awake/alert ?Behavior During Therapy: [facility] for tasks assessed/performed ?Overall Cognitive Status: Within Functional Limits for tasks assessed ?  ?  ?  ?  ?  ?  ?  ?  ?  ?  ?  ?  ?  ?  ?  ?  ?  ?  ?  ? ?  ?Exercises   ? ?  ?General Comments   ?  ?  ? ?Pertinent Vitals/Pain Pain Assessment ?Pain Assessment: No/denies pain  ? ? ?Home Living   ?  ?  ?  ?  ?  ?  ?  ?  ?  ?   ?  ?  Prior Function    ?  ?  ?   ? ?PT Goals (current goals can now be found in the care plan section) Acute Rehab PT Goals ?Patient Stated Goal: return home with assist and home health ?PT Goal Formulation: With patient ?Time For Goal Achievement: 07/30/21 ?Potential to Achieve Goals: Good ?Progress towards PT goals: Progressing toward goals ? ?  ?Frequency ? ? ? Min 2X/week ? ? ? ?  ?PT Plan Current plan remains appropriate  ? ? ?Co-evaluation   ?  ?  ?  ?  ? ?   ?AM-PAC PT "6 Clicks" Mobility   ?Outcome Measure ? Help needed turning from your back to your side while in a flat bed without using bedrails?: None ?Help needed moving from lying on your back to sitting on the side of a flat bed without using bedrails?: A Little ?Help needed moving to and from a bed to a chair (including a wheelchair)?: A Little ?Help needed standing up from a chair using your arms (e.g., wheelchair or bedside chair)?: A Little ?Help needed to walk in hospital room?: A Little ?Help needed climbing 3-5 steps with a railing? : A Little ?6 Click Score: 19 ? ?  ?End of Session Equipment Utilized During Treatment: Gait belt ?Activity Tolerance: Patient tolerated treatment well ?Patient left: in chair;with chair alarm set;with call bell/phone within reach ?Nurse Communication: Mobility status ?PT Visit Diagnosis: Unsteadiness on feet (R26.81);Repeated falls (R29.6);Other abnormalities of gait and mobility (R26.89);Difficulty in walking, not elsewhere classified (R26.2) ?  ? ? ?Time: 8527-7824 ?PT Time Calculation (min) (ACUTE ONLY): 24 min ? ?Charges:  $Gait Training: 23-37 mins          ?          ? ?Lissa Merlin, PT, GCS ?07/17/21,1:47 PM ? ?

## 2021-07-17 NOTE — Discharge Instructions (Signed)

## 2021-07-17 NOTE — Progress Notes (Signed)
?PROGRESS NOTE ? ?Jennifer [name]:[id number] DOB: 06-03-1931 DOA: 07/15/2021 ?PCP: Luciana Axe, NP ? ?Brief History   ?Jennifer [name] is a 88 y.o. 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  The patient states over the last week she has been very thirsty and using the bathroom every 30 minutes and having a dry mouth.  This morning she was not feeling quite well and lost her balance and fell backwards.  No loss of consciousness.  She was able to crawl and scoot into the other room and call for help.  She was unable to get up.  In the ER she was found to have a sugar of greater than 600 and elevated lactic acid level.  Hospitalist services were contacted for admission.  ER physician ordered an insulin drip. ? ?[facility] were consulted to admit the patient for further evaluation and treatment.  ? ?It was discontinued last night. The patient has been controlled on lantus and as needed correction insulin. She is being educated on self-administration of insulin.  ? ?Consultants  ?None ? ?Procedures  ?None ? ?Antibiotics  ? ?Anti-infectives (From admission, onward)  ? ? None  ? ?  ? ?Interval History/Subjective  ?The patient is resting quietly. No new complaints. ? ?Objective  ? ?Vitals:  ?Vitals:  ? 07/17/21 0737 07/17/21 1205  ?BP: 111/81 (!) 112/59  ?Pulse: 79 75  ?Resp: 15 16  ?Temp: 98 ?F (36.7 ?C) 98 ?F (36.7 ?C)  ?SpO2: 97% 98%  ? ? ?Exam: ? ?Constitutional:  ?The patient is awake, alert, and oriented x 3. No acute distress. ? ?Respiratory:  ?No increased work of breathing. ?No wheezes, rales, or rhonchi ?No tactile fremitus ?Cardiovascular:  ?Regular rate and rhythm ?No murmurs, ectopy, or gallups. ?No lateral PMI. No thrills. ?Abdomen:  ?Abdomen is soft, non-tender, non-distended ?No hernias, masses, or organomegaly ?Normoactive bowel sounds.  ?Musculoskeletal:  ?No  cyanosis, clubbing, or edema ?Skin:  ?No rashes, lesions, ulcers ?palpation of skin: no induration or nodules ?Neurologic:  ?CN 2-12 intact ?Sensation all 4 extremities intact ?Psychiatric:  ?Mental status ?Mood, affect appropriate ?Orientation to person, place, time  ?judgment and insight appear intact ? ? ?I have personally reviewed the following:  ? ?Today's Data  ?Vitals ? ?Lab Data  ?BMP ? ?Micro Data  ? ? ?Imaging  ?CT Head ?X-ray of the pelvis ? ?Cardiology Data  ?EKG ? ?Other Data  ? ? ?Scheduled Meds: ? diltiazem  240 mg Oral BH-q7a  ? insulin aspart  0-5 Units Subcutaneous QHS  ? insulin aspart  0-9 Units Subcutaneous TID WC  ? insulin aspart  3 Units Subcutaneous TID WC  ? insulin glargine-yfgn  20 Units Subcutaneous QPM  ? lisinopril  20 mg Oral Daily  ? pantoprazole  40 mg Oral Daily  ? Rivaroxaban  15 mg Oral Q supper  ? simvastatin  10 mg Oral QHS  ? ?Continuous Infusions: ? sodium chloride 40 mL/hr at 07/16/21 1457  ? ? ?Principal Problem: ?  Hyperosmolar coma due to type 2 diabetes mellitus ([facility]) ?Active Problems: ?  Hyponatremia ?  A-fib ([facility]) ?  S/p reverse total shoulder arthroplasty ?  Essential hypertension ? ? LOS: 2 days  ? ?A & P  ?1.  Hyperosmolar coma secondary to type 2 diabetes mellitus.  Sugar above 600.  The patient was placed on an insulin drip. Her gap is closed. She is currently  receiving Sub Q lantus and correction insulin.She is being educated on how to administer her own insulin. She has yet to give her self her insulin and to prove competency with the activity. She should be discharged to home with CGM. Reportedly the patient will have a friend who will be available to place CGM and administer her insulin. ? ?2.  Hyponatremia.  Resolved. Sodium is low secondary to high sugars.  This should improve once sugars better controlled. Corrected. ? ?3.  Essential hypertension continue Cardizem CD and lisinopril. The patient is normotensive. ? ?4.  Chronic atrial fibrillation continue  Cardizem CD and Xarelto. Heart rate is currently controlled. ? ?5.  Recent shoulder surgery.  PT and OT consultations ? ?6.  Fall.  ER physician ordered CT scan of the head.  Results still pending.  I ordered a pelvis x-ray. ?  ?I have seen and examined this patient myself. I have spent 34 minutes in her evaluation and care. ? ?DVT prophylaxis: Xarelto ?Code Status: Full Code ?Family Communication: None available ?Disposition Plan: home  ? ? ?Tina Gruner, DO ?[facility] ?Direct contact: see [email or web address]  ?7PM-7AM contact night coverage as above ?07/17/2021, 1:26 PM  LOS: 1 day  ? ? ? ? ? ?  ?

## 2021-07-17 NOTE — Progress Notes (Signed)
Inpatient Diabetes Program Recommendations ? ?AACE/ADA: New Consensus Statement on Inpatient Glycemic Control (2015) ? ?Target Ranges:  Prepandial:   less than 140 mg/dL ?     Peak postprandial:   less than 180 mg/dL (1-2 hours) ?     Critically ill patients:  140 - 180 mg/dL  ? ?Lab Results  ?Component Value Date  ? GLUCAP 308 (H) 07/17/2021  ? HGBA1C 13.7 (H) 07/15/2021  ? ? ? ? ?Inpatient Diabetes Program Recommendations:   ? ? ?Pt would benefit from [employer] CGM at home for glucose checks please order if being discharged in addition to her other DM supplies. ? ?-  Lantus solostar insulin pen  order [phone] (seems to be covered more by insurance) ?-  Insulin pen needles order # [id number] ?-  Freestyle Libre 2 Reader order # [id number] ?-  [employer]estyle [employer] 2 Sensors order # [id number] ? ?Thanks, ? ?Christena Deem RN, MSN, BC-ADM ?Inpatient Diabetes Coordinator ?Team Pager [phone] (8a-5p) ? ? ?

## 2021-07-17 NOTE — Progress Notes (Signed)
Occupational Therapy Treatment ?Patient Details ?Name: Jennifer [name] ?MRN: [id number] ?DOB: Nov 05, 1931 ?Today's Date: 07/17/2021 ? ? ?History of present illness Teyanna Haidyn Copley is a 88 y.o. 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 Admitted for fall 08/04/21 and in the ER she was found to have a sugar of greater than 600 and elevated lactic acid level. ?  ?OT comments ? Ms Truby was seen for OT treatment on this date. Upon arrival to room pt reclined in chair, agreeable to tx. Pt reports feeling constipated (RN notified), requests to use bathroom with no successful BM. Pt requires SBA + RW sit<>stand at recliner decreasing to MIN A sit>stand from low toilet height with limited LUE WBing. MOD I pericare in sitting. SBA + RW hand washing and clothing mgmt in standing. Pt making good progress toward goals. Pt continues to benefit from skilled OT services to maximize return to PLOF and minimize risk of future falls, injury, caregiver burden, and readmission. Will continue to follow POC. Discharge recommendation remains appropriate.  ?  ? ?Recommendations for follow up therapy are one component of a multi-disciplinary discharge planning process, led by the attending physician.  Recommendations may be updated based on patient status, additional functional criteria and insurance authorization. ?   ?Follow Up Recommendations ? Home health OT  ?  ?Assistance Recommended at Discharge Intermittent Supervision/Assistance  ?Patient can return home with the following ? A little help with walking and/or transfers;A little help with bathing/dressing/bathroom;Help with stairs or ramp for entrance ?  ?Equipment Recommendations ? BSC/3in1  ?  ?Recommendations for Other Services   ? ?  ?Precautions / Restrictions Precautions ?Precautions: Fall ?Restrictions ?Weight Bearing Restrictions: No ?Other  Position/Activity Restrictions: L TSA on 05/05/2021. Per Dr Posey Pronto 06/26/2021 note in Care Everywhere: "Can continue activities of daily living. Avoid pushing or pulling behind the body."  ? ? ?  ? ?Mobility Bed Mobility ?  ?  ?  ?  ?  ?  ?  ?General bed mobility comments: received and left in chair ?  ? ?Transfers ?Overall transfer level: Needs assistance ?Equipment used: Rolling walker (2 wheels) ?Transfers: Sit to/from Stand ?Sit to Stand: Min guard ?  ?  ?  ?  ?  ?General transfer comment: no assist from reclined, MIN A from low toilet ?  ?  ?Balance Overall balance assessment: Needs assistance ?Sitting-balance support: No upper extremity supported, Feet supported ?Sitting balance-Leahy Scale: Normal ?  ?  ?Standing balance support: No upper extremity supported, During functional activity ?Standing balance-Leahy Scale: Good ?  ?  ?  ?  ?  ?  ?  ?  ?  ?  ?  ?  ?   ? ?ADL either performed or assessed with clinical judgement  ? ?ADL Overall ADL's : Needs assistance/impaired ?  ?  ?  ?  ?  ?  ?  ?  ?  ?  ?  ?  ?  ?  ?  ?  ?  ?  ?  ?General ADL Comments: SBA + RW for toilet t/f , pericare - decreasing to MIN A to rise from low toilet height with limited LUE WBing. SBA + RW hand washing and clothing mgmt in standing. ?  ? ? ? ?Cognition Arousal/Alertness: Awake/alert ?Behavior During Therapy: [facility] for tasks assessed/performed ?Overall Cognitive Status: Within Functional Limits for tasks assessed ?  ?  ?  ?  ?  ?  ?  ?  ?  ?  ?  ?  ?  ?  ?  ?  ?  ?  ?  ?   ? ?  Frequency ? Min 2X/week  ? ? ? ? ?  ?Progress Toward Goals ? ?OT Goals(current goals can now be found in the care plan section) ? Progress towards OT goals: Progressing toward goals ? ?Acute Rehab OT Goals ?Patient Stated Goal: to go home ?OT Goal Formulation: With patient ?Time For Goal Achievement: 07/30/21 ?Potential to Achieve Goals: Good ?ADL Goals ?Pt Will Perform Grooming: with modified independence;standing ?Pt Will Perform Lower Body Dressing: with modified  independence;sit to/from stand ?Pt Will Transfer to Toilet: with modified independence;ambulating;regular height toilet ?Additional ADL Goal #1: Pt will ID and impement x3 falls prevention strategies  ?Plan Discharge plan remains appropriate;Frequency remains appropriate   ? ?   ?AM-PAC OT "6 Clicks" Daily Activity     ?Outcome Measure ? ? Help from another person eating meals?: None ?Help from another person taking care of personal grooming?: A Little ?Help from another person toileting, which includes using toliet, bedpan, or urinal?: A Little ?Help from another person bathing (including washing, rinsing, drying)?: A Little ?Help from another person to put on and taking off regular upper body clothing?: A Little ?Help from another person to put on and taking off regular lower body clothing?: A Little ?6 Click Score: 19 ? ?  ?End of Session Equipment Utilized During Treatment: Rolling walker (2 wheels);Gait belt ? ?OT Visit Diagnosis: Other abnormalities of gait and mobility (R26.89) ?  ?Activity Tolerance Patient tolerated treatment well ?  ?Patient Left in chair;with call bell/phone within reach;with chair alarm set ?  ?Nurse Communication Mobility status ?  ? ?   ? ?Time: [id number] ?OT Time Calculation (min): 25 min ? ?Charges: OT General Charges ?$OT Visit: 1 Visit ?OT Treatments ?$Self Care/Home Management : 23-37 mins ? ?Dessie Coma, M.S. OTR/L  ?07/17/21, 2:29 PM  ?ascom [phone] ? ?

## 2021-07-17 NOTE — TOC Progression Note (Signed)
Transition of Care (TOC) - Progression Note  ? ? ?Patient Details  ?Name: Jennifer [name] ?MRN: [id number] ?Date of Birth: [birth year] ? ?Transition of Care (TOC) CM/SW Contact  ?Caryn Section, RN ?Phone Number: ?07/17/2021, 3:45 PM ? ?Clinical Narrative:   [facility] to accept patient for services as per Barbara Cower at agency. ? ? ? ?  ?  ? ?Expected Discharge Plan and Services ?  ?  ?  ?  ?  ?                ?  ?  ?  ?  ?  ?  ?  ?  ?  ?  ? ? ?Social Determinants of Health (SDOH) Interventions ?  ? ?Readmission Risk Interventions ?   ? View : No data to display.  ?  ?  ?  ? ? ?

## 2021-07-17 NOTE — Plan of Care (Signed)
?  RD consulted for nutrition education regarding diabetes.  ? ?Lab Results  ?Component Value Date  ? HGBA1C 13.7 (H) 07/15/2021  ?PTA DM medications are none. ? ?Labs reviewed: Mg: 1.5, CBGS: 108-369 (inpatient orders for glycemic control are 0-5 units insulin aspart daily at bedtime, 0-9 units insulin aspart TID with meals, 3 units insulin aspart TID with meals, and 20 units insulin glargine-yfgn daily).   ? ?Spoke with pt, who was sitting in recliner chair at time of visit. Pt shares that she has been diagnosed with pre-diabetes for a number of years and has never received any medications for diabetes management. Pt consumes 3 meals per day (Breakfast: coffee and toast or blueberry muffin and milk; Lunch: sandwich; Dinner: meat, starch, and vegetable).  ? ?Pt shares that she has a friend who has DM who has volunteer to come stay with her. Pt reports she is a little anxious to take insulin, but knows that her friend will help her. Encouraged pt to take an active part in her DM management by practicing fingersticks and giving herself injections. Educated pt on importance of rotating injection site.  ? ?Discussed importance of regular meal schedule as well as signs and symptoms of hypoglycemia. Discussed how to correct for hypoglycemia (rule of 15).  ? ?Nutrition-Focused physical exam completed. Findings are mild to moderate fat depletion, mild to moderate muscle depletion, and no edema.  Suspect that muscle depletions are related to advanced age, recent shoulder surgery, and history of polio on lt lower extremity.  ? ?RD also to refer to [place] for further support.  ? ?Labs reviewed: Mg: 1.5, CBGS: 108-369 (inpatient orders for glycemic control are 0-5 units insulin aspart daily at bedtime, 0-9 units insulin aspart TID with meals, 3 units insulin aspart TID with meals, and 20 units insulin glargine-yfgn daily).   ? ?RD provided "Carbohydrate Counting for People with  Diabetes" handout from the Academy of Nutrition and Dietetics. Discussed different food groups and their effects on blood sugar, emphasizing carbohydrate-containing foods. Provided list of carbohydrates and recommended serving sizes of common foods. ? ?Discussed importance of controlled and consistent carbohydrate intake throughout the day. Provided examples of ways to balance meals/snacks and encouraged intake of high-fiber, whole grain complex carbohydrates. Teach back method used. ? ?Expect fair to good compliance. ? ?Current diet order is heart healthy/ carb modified (will liberalize to carb modified), patient is consuming approximately n/a% of meals at this time. Labs and medications reviewed. No further nutrition interventions warranted at this time. RD contact information provided. If additional nutrition issues arise, please re-consult RD. ? ?Levada Schilling, RD, LDN, CDCES ?Registered Dietitian II ?Certified Diabetes Care and Education Specialist ?Please refer to [facility] for RD and/or RD on-call/weekend/after hours pager   ?

## 2021-07-18 DIAGNOSIS — E1101 Type 2 diabetes mellitus with hyperosmolarity with coma: Secondary | ICD-10-CM | POA: Diagnosis not present

## 2021-07-18 LAB — GLUCOSE, CAPILLARY
Glucose-Capillary: 279 mg/dL — ABNORMAL HIGH (ref 70–99)
Glucose-Capillary: 287 mg/dL — ABNORMAL HIGH (ref 70–99)

## 2021-07-18 MED ORDER — INSULIN PEN NEEDLE 32G X 4 MM MISC: 0 refills | Status: DC

## 2021-07-18 MED ORDER — FREESTYLE LIBRE 2 SENSOR MISC: 0 refills | Status: AC

## 2021-07-18 MED ORDER — FREESTYLE LIBRE 2 READER DEVI: 0 refills | Status: AC

## 2021-07-18 MED ORDER — INSULIN GLARGINE 100 UNIT/ML SOLOSTAR PEN: 20.0000 [IU] | PEN_INJECTOR | Freq: Every day | SUBCUTANEOUS | 11 refills | Status: DC

## 2021-07-18 NOTE — Progress Notes (Signed)
Patient needs to learn to check her blood sugars and give herself insulin TODAY.  Also needs to ambulate q 2 hours in anticipation of discharge today. ?Marlin Canary ?

## 2021-07-18 NOTE — Progress Notes (Signed)
Inpatient Diabetes Program Recommendations ? ?AACE/ADA: New Consensus Statement on Inpatient Glycemic Control (2015) ? ?Target Ranges:  Prepandial:   less than 140 mg/dL ?     Peak postprandial:   less than 180 mg/dL (1-2 hours) ?     Critically ill patients:  140 - 180 mg/dL  ? ?Lab Results  ?Component Value Date  ? GLUCAP 287 (H) 07/18/2021  ? HGBA1C 13.7 (H) 07/15/2021  ? ? ?Review of insulin education and placement of Freestyle Libre CGM ? ?Spoke with pt and reviewed insulin administration with insulin pen. Pt able to return demonstrate. Pt has assistance at home from a friend who has type 1 diabetes and uses injections. Placed Freestyle Libre CGM on pts right arm. Programed reader and initiated warm up time. Informed pt and her friend that the doctor is working on her discharge. Friend is in the area and can pick up pt. ? ?Thanks, ? ?Christena Deem RN, MSN, BC-ADM ?Inpatient Diabetes Coordinator ?Team Pager [phone] (8a-5p) ? ? ?

## 2021-07-18 NOTE — Discharge Summary (Signed)
?Physician Discharge Summary ?  ?Patient: Jennifer [name] MRN: [id number] DOB: Jun 30, 1931  ?Admit date:     07/15/2021  ?Discharge date: 07/18/21  ?Discharge Physician: Joseph Art  ? ?PCP: Luciana Axe, NP  ? ?Recommendations at discharge:  ? ? Adjust lantus as needed ?PT/OT ? ?Discharge Diagnoses: ?Principal Problem: ?  Hyperosmolar coma due to type 2 diabetes mellitus (HCC) ?Active Problems: ?  Hyponatremia ?  A-fib (HCC) ?  Essential hypertension ? ? ? ?Hospital Course: ?Jennifer [name] is a 88 y.o. 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  The patient states over the last week she has been very thirsty and using the bathroom every 30 minutes and having a dry mouth.  This morning she was not feeling quite well and lost her balance and fell backwards.  No loss of consciousness.  She was able to crawl and scoot into the other room and call for help.  She was unable to get up.  In the ER she was found to have a sugar of greater than 600 and elevated lactic acid level.  Hospitalist services were contacted for admission.  ER physician ordered an insulin drip ? ?Assessment and Plan: ?* Hyperosmolar coma due to type 2 diabetes mellitus (HCC) ?The patient was placed on an insulin drip. Her gap is closed. She is currently receiving Sub Q lantus and correction insulin.She is being educated on how to administer her own insulin. . She should be discharged to home with CGM. Reportedly the patient will have a friend who will be available to place CGM and administer her insulin ? ?Hyponatremia ?  Resolved. Sodium was low secondary to high sugars.  ? ?Essential hypertension ?Patient on lisinopril and Cardizem CD. ? ?A-fib (HCC) ?Chronic atrial fibrillation on Cardizem CD.  Xarelto for anticoagulation. ? ? ? ? ? ? ? ?Disposition: Home ?Diet recommendation:  ?Discharge Diet Orders (From  admission, onward)  ? ?  Start     Ordered  ? 07/18/21 0000  Diet Carb Modified       ? 07/18/21 1321  ? ?  ?  ? ?  ? ?Cardiac and Carb modified diet ?DISCHARGE MEDICATION: ?Allergies as of 07/18/2021   ? ?   Reactions  ? Esomeprazole Magnesium Diarrhea  ? *Nexium   ? Nitrofurantoin   ? "Flu"  ? ?  ? ?  ?Medication List  ?  ? ?STOP taking these medications   ? ?oxyCODONE 5 MG immediate release tablet ?Commonly known as: Oxy IR/ROXICODONE ?  ? ?  ? ?TAKE these medications   ? ?acetaminophen 500 MG tablet ?Commonly known as: TYLENOL ?Take 1-2 tablets (500-1,000 mg total) by mouth every 8 (eight) hours as needed for mild pain. ?  ?diltiazem 240 MG 24 hr capsule ?Commonly known as: CARDIZEM CD ?Take 240 mg by mouth every morning. ?  ?FreeStyle [place] 2 Reader Hardie Pulley ?Applied in hospital ?  ?FreeStyle Libre 2 Sensor Misc ?Applied in hospital ?  ?insulin glargine 100 UNIT/ML Solostar Pen ?Commonly known as: LANTUS ?Inject 20 Units into the skin daily. ?  ?Insulin Pen Needle 32G X 4 MM Misc ?Use with lantus pen ?  ?lisinopril 20 MG tablet ?Commonly known as: ZESTRIL ?Take 20 mg by mouth daily. ?  ?omeprazole 20 MG capsule ?Commonly known as: PRILOSEC ?Take 20 mg by mouth every morning. Pt may take a 2nd dose in the evening if needed  for acid reflux ?  ?ondansetron 4 MG tablet ?Commonly known as: ZOFRAN ?Take 1 tablet (4 mg total) by mouth every 6 (six) hours as needed for nausea. ?  ?rivaroxaban 20 MG Tabs tablet ?Commonly known as: XARELTO ?Take 1 tablet (20 mg total) by mouth daily with supper. ?  ?simvastatin 10 MG tablet ?Commonly known as: ZOCOR ?Take 10 mg by mouth at bedtime. ?  ?Vitamin D (Ergocalciferol) 1.25 MG (50000 UNIT) Caps capsule ?Commonly known as: DRISDOL ?Take 50,000 Units by mouth every 14 (fourteen) days. ?  ? ?  ? ? ?Discharge Exam: ?Filed Weights  ? 07/15/21 1329  ?Weight: 55.3 kg  ? ? ? ?Condition at discharge: good ? ?The results of significant diagnostics from this hospitalization (including imaging,  microbiology, ancillary and laboratory) are listed below for reference.  ? ?Imaging Studies: ?DG Pelvis 1-2 Views ? ?Result Date: 07/15/2021 ?CLINICAL DATA:  Fell at home EXAM: PELVIS - 1-2 VIEW COMPARISON:  08/05/2012 FINDINGS: SI joints are non widened. Pubic symphysis and rami are intact. Left hip replacement with intact hardware and normal alignment. No acute displaced fracture. IMPRESSION: Left hip replacement.  No acute osseous abnormality Electronically Signed   By: Jasmine Pang M.D.   On: 07/15/2021 16:46  ? ?CT Head Wo Contrast ? ?Result Date: 07/15/2021 ?CLINICAL DATA:  Head trauma EXAM: CT HEAD WITHOUT CONTRAST TECHNIQUE: Contiguous axial images were obtained from the base of the skull through the vertex without intravenous contrast. RADIATION DOSE REDUCTION: This exam was performed according to the departmental dose-optimization program which includes automated exposure control, adjustment of the mA and/or kV according to patient size and/or use of iterative reconstruction technique. COMPARISON:  None Available. FINDINGS: Brain: No evidence of acute infarction, hemorrhage, hydrocephalus, extra-axial collection or mass lesion/mass effect.Atrophy and mild chronic small vessel ischemic changes of the white matter. Vascular: No hyperdense vessel or unexpected calcification.Carotid vascular calcification. Skull: Normal. Negative for fracture or focal lesion. Sinuses/Orbits: No acute finding. Other: None. IMPRESSION: 1.  No CT evidence for acute intracranial abnormality. 2.  Atrophy and mild chronic small vessel ischemic changes. Electronically Signed   By: Jasmine Pang M.D.   On: 07/15/2021 16:19  ? ?DG Chest Portable 1 View ? ?Result Date: 07/15/2021 ?CLINICAL DATA:  Weakness, fell at home, hyperglycemia, COPD, hypertension EXAM: PORTABLE CHEST 1 VIEW COMPARISON:  Portable exam 1525 hours compared to 06/17/2020 FINDINGS: Normal heart size, mediastinal contours, and pulmonary vascularity. Central peribronchial  thickening with minimal scarring RIGHT base. Lungs otherwise clear. No pulmonary infiltrate, pleural effusion, or pneumothorax. Bones demineralized with LEFT shoulder prosthesis noted. IMPRESSION: Bronchitic changes without infiltrate. Electronically Signed   By: Ulyses Southward M.D.   On: 07/15/2021 15:36   ? ?Microbiology: ?Results for orders placed or performed during the hospital encounter of 05/02/21  ?SARS CORONAVIRUS 2 (TAT 6-24 HRS) Nasopharyngeal Nasopharyngeal Swab     Status: None  ? Collection Time: 05/02/21  9:33 AM  ? Specimen: Nasopharyngeal Swab  ?Result Value Ref Range Status  ? SARS Coronavirus 2 NEGATIVE NEGATIVE Final  ?  Comment: (NOTE) ?SARS-CoV-2 target nucleic acids are NOT DETECTED. ? ?The SARS-CoV-2 RNA is generally detectable in upper and lower ?respiratory specimens during the acute phase of infection. Negative ?results do not preclude SARS-CoV-2 infection, do not rule out ?co-infections with other pathogens, and should not be used as the ?sole basis for treatment or other patient management decisions. ?Negative results must be combined with clinical observations, ?patient history, and epidemiological information. The expected ?result is  Negative. ? ?Fact Sheet for Patients: ?HairSlick.nohttps://www.fda.gov/media/138098/download ? ?Fact Sheet for Healthcare Providers: ?quierodirigir.comhttps://www.fda.gov/media/138095/download ? ?This test is not yet approved or cleared by the [place] FDA and  ?has been authorized for detection and/or diagnosis of SARS-CoV-2 by ?FDA under an Emergency Use Authorization (EUA). This EUA will remain  ?in effect (meaning this test can be used) for the duration of the ?COVID-19 declaration under Se ction 564(b)(1) of the Act, 21 U.S.C. ?section 360bbb-3(b)(1), unless the authorization is terminated or ?revoked sooner. ? ?Performed at [facility], [address] [place], [place] ?[id number] ?  ? ? ?Labs: ?CBC: ?Recent Labs  ?Lab 07/15/21 ?1328  ?WBC 7.5  ?NEUTROABS 5.5  ?HGB 12.0   ?HCT 38.4  ?MCV 89.9  ?PLT 312  ? ?Basic Metabolic Panel: ?Recent Labs  ?Lab 07/15/21 ?1328 07/15/21 ?2022 07/16/21 ?0427  ?NA 130* 135 139  ?K 4.7 4.3 3.9  ?CL 95* 102 109  ?CO2 21* 22 25  ?GLUCOSE 626* 418*

## 2021-07-18 NOTE — Care Management Important Message (Signed)
Important Message ? ?Patient Details  ?Name: Jennifer [name] ?MRN: [id number] ?Date of Birth: [birth year] ? ? ?Medicare Important Message Given:  N/A - LOS <3 / Initial given by admissions ? ? ? ? ?Olegario Messier A Thurlow Gallaga ?07/18/2021, 8:04 AM ?

## 2021-07-31 ENCOUNTER — Emergency Department
Admission: EM | Admit: 2021-07-31 | Discharge: 2021-07-31 | Disposition: A | Discharge status: Home / Self Care | Payer: Medicare Other | Attending: Emergency Medicine | Admitting: Emergency Medicine

## 2021-07-31 ENCOUNTER — Other Ambulatory Visit: Payer: Self-pay

## 2021-07-31 DIAGNOSIS — Z760 Encounter for issue of repeat prescription: Secondary | ICD-10-CM | POA: Diagnosis not present

## 2021-07-31 DIAGNOSIS — R739 Hyperglycemia, unspecified: Secondary | ICD-10-CM

## 2021-07-31 DIAGNOSIS — I4891 Unspecified atrial fibrillation: Secondary | ICD-10-CM | POA: Diagnosis not present

## 2021-07-31 DIAGNOSIS — I10 Essential (primary) hypertension: Secondary | ICD-10-CM | POA: Insufficient documentation

## 2021-07-31 DIAGNOSIS — E1165 Type 2 diabetes mellitus with hyperglycemia: Secondary | ICD-10-CM | POA: Insufficient documentation

## 2021-07-31 DIAGNOSIS — Z794 Long term (current) use of insulin: Secondary | ICD-10-CM | POA: Diagnosis not present

## 2021-07-31 LAB — COMPREHENSIVE METABOLIC PANEL
ALT: 15 U/L (ref 0–44)
AST: 18 U/L (ref 15–41)
Albumin: 3.7 g/dL (ref 3.5–5.0)
Alkaline Phosphatase: 96 U/L (ref 38–126)
Anion gap: 10 (ref 5–15)
BUN: 15 mg/dL (ref 8–23)
CO2: 25 mmol/L (ref 22–32)
Calcium: 9.7 mg/dL (ref 8.9–10.3)
Chloride: 106 mmol/L (ref 98–111)
Creatinine, Ser: 0.66 mg/dL (ref 0.44–1.00)
GFR, Estimated: >60 mL/min (ref >60)
Glucose, Bld: 241 mg/dL — ABNORMAL HIGH (ref 70–99)
Potassium: 4 mmol/L (ref 3.5–5.1)
Sodium: 141 mmol/L (ref 135–145)
Total Bilirubin: 0.4 mg/dL (ref 0.3–1.2)
Total Protein: 7.1 g/dL (ref 6.5–8.1)

## 2021-07-31 LAB — CBC WITH DIFFERENTIAL/PLATELET
Abs Immature Granulocytes: 0.02 10*3/uL (ref 0.00–0.07)
Basophils Absolute: 0.1 10*3/uL (ref 0.0–0.1)
Basophils Relative: 1 %
Eosinophils Absolute: 0.1 10*3/uL (ref 0.0–0.5)
Eosinophils Relative: 1 %
HCT: 35.1 % — ABNORMAL LOW (ref 36.0–46.0)
Hemoglobin: 10.6 g/dL — ABNORMAL LOW (ref 12.0–15.0)
Immature Granulocytes: 0 %
Lymphocytes Relative: 24 %
Lymphs Abs: 1.6 10*3/uL (ref 0.7–4.0)
MCH: 28.2 pg (ref 26.0–34.0)
MCHC: 30.2 g/dL (ref 30.0–36.0)
MCV: 93.4 fL (ref 80.0–100.0)
Monocytes Absolute: 0.6 10*3/uL (ref 0.1–1.0)
Monocytes Relative: 9 %
Neutro Abs: 4.4 10*3/uL (ref 1.7–7.7)
Neutrophils Relative %: 65 %
Platelets: 417 10*3/uL — ABNORMAL HIGH (ref 150–400)
RBC: 3.76 MIL/uL — ABNORMAL LOW (ref 3.87–5.11)
RDW: 15.1 % (ref 11.5–15.5)
WBC: 6.7 10*3/uL (ref 4.0–10.5)
nRBC: 0 % (ref 0.0–0.2)

## 2021-07-31 LAB — CBG MONITORING, ED: Glucose-Capillary: 250 mg/dL — ABNORMAL HIGH (ref 70–99)

## 2021-07-31 MED ORDER — INSULIN GLARGINE-YFGN 100 UNIT/ML ~~LOC~~ SOLN
20.0000 [IU] | Freq: Once | SUBCUTANEOUS | Status: AC
  Administered 2021-07-31: 20 [IU] via SUBCUTANEOUS
  Filled 2021-07-31: qty 0.2

## 2021-07-31 MED ORDER — INSULIN GLARGINE 100 UNIT/ML SOLOSTAR PEN: 25.0000 [IU] | PEN_INJECTOR | Freq: Every day | SUBCUTANEOUS | 2 refills | Status: AC

## 2021-07-31 MED ORDER — INSULIN PEN NEEDLE 32G X 4 MM MISC: 0 refills | Status: AC

## 2021-07-31 MED ORDER — INSULIN PEN NEEDLE 32G X 4 MM MISC: 0 refills | Status: DC

## 2021-07-31 MED ORDER — INSULIN GLARGINE 100 UNIT/ML SOLOSTAR PEN
20.0000 [IU] | PEN_INJECTOR | Freq: Every day | SUBCUTANEOUS | 2 refills | Status: DC
Start: 2021-07-31 — End: 2021-07-31

## 2021-07-31 NOTE — Discharge Instructions (Signed)
Please seek medical attention for any high fevers, chest pain, shortness of breath, change in behavior, persistent vomiting, bloody stool or any other new or concerning symptoms.  

## 2021-07-31 NOTE — ED Notes (Signed)
Patient assisted to walk to bedside commode.  Steady gait noted

## 2021-07-31 NOTE — ED Provider Notes (Signed)
[facility] Provider Note    Event Date/Time   First MD Initiated Contact with Patient 07/31/21 2033     (approximate)   History   Hyperglycemia   HPI  Alila Amarisa Wilinski is a 88 y.o. female  who, per clinic note from yesterday has history of HTN, atrial fibrillation, DM, HLD, who presents to the emergency department today because of concern for hyperglycemia and being out of her insulin.  Patient states that sugars went up into the 360s today.  She denies any symptoms from this.  Called her primary care doctor's office who recommended ED evaluation.  Patient denies any recent illness, fevers.      Physical Exam   Triage Vital Signs: ED Triage Vitals  Enc Vitals Group     BP 07/31/21 1733 130/76     Pulse Rate 07/31/21 1733 66     Resp 07/31/21 1733 18     Temp 07/31/21 1733 98.2 F (36.8 C)     Temp Source 07/31/21 1733 Oral     SpO2 07/31/21 1733 99 %     Weight 07/31/21 1734 121 lb 14.6 oz (55.3 kg)     Height 07/31/21 1734 4\' 11"  (1.499 m)     Head Circumference --      Peak Flow --      Pain Score 07/31/21 1734 0     Pain Loc --      Pain Edu? --      Excl. in GC? --     Most recent vital signs: Vitals:   07/31/21 1733 07/31/21 2000  BP: 130/76 (!) 135/54  Pulse: 66 67  Resp: 18 20  Temp: 98.2 F (36.8 C)   SpO2: 99% 98%    General: Awake, alert, oriented. CV:  Good peripheral perfusion. Regular rate and rhythm.  Resp:  Normal effort. Lungs clear to auscultation. Abd:  No distention. Non tender.    ED Results / Procedures / Treatments   Labs (all labs ordered are listed, but only abnormal results are displayed) Labs Reviewed  CBC WITH DIFFERENTIAL/PLATELET - Abnormal; Notable for the following components:      Result Value   RBC 3.76 (*)    Hemoglobin 10.6 (*)    HCT 35.1 (*)    Platelets 417 (*)    All other components within normal limits  COMPREHENSIVE METABOLIC PANEL - Abnormal; Notable for the following  components:   Glucose, Bld 241 (*)    All other components within normal limits  CBG MONITORING, ED - Abnormal; Notable for the following components:   Glucose-Capillary 250 (*)    All other components within normal limits     EKG  None   RADIOLOGY None   PROCEDURES:  Critical Care performed: No  Procedures   MEDICATIONS ORDERED IN ED: Medications - No data to display   IMPRESSION / MDM / ASSESSMENT AND PLAN / ED COURSE  I reviewed the triage vital signs and the nursing notes.                              Differential diagnosis includes, but is not limited to, DKA, hyperosmolar, hyperglycemia secondary to being out of insulin.  Patient presents to the emergency department today because of concerns for elevated blood sugar in the context of not having any more of her insulin.  While she was prescribed 20 units twice a day at time of discharge she  does state that this was up to 25 units twice a day.  She denies any symptoms.  Work-up here is not consistent with DKA.  Will give patient dose of insulin here and will give patient prescription for her insulin.  FINAL CLINICAL IMPRESSION(S) / ED DIAGNOSES   Final diagnoses:  Hyperglycemia  Medication refill       Note:  This document was prepared using Dragon voice recognition software and may include unintentional dictation errors.    Phineas Semen, MD 07/31/21 2056

## 2021-07-31 NOTE — ED Notes (Signed)
Dark green tube sent to lab if beta hydroxybute needed

## 2021-07-31 NOTE — ED Triage Notes (Signed)
Pt presents to ED with c/o of hyperglycemia and states she has been recently DX'ed with type 2 DM but states she has run "out of her insulin pen".   Pt denies any s/s at this time.

## 2021-08-22 NOTE — Progress Notes (Signed)
Preoperative testing with medical comorbidities
